# Patient Record
Sex: Female | Born: 1955 | Race: White | Hispanic: No | Marital: Married | Smoking: Former smoker
Health system: Southern US, Community
[De-identification: ages and names within clinical notes are randomized; demographics above are authoritative.]

## PROBLEM LIST (undated history)

## (undated) DIAGNOSIS — I4891 Unspecified atrial fibrillation: Secondary | ICD-10-CM

## (undated) DIAGNOSIS — M8580 Other specified disorders of bone density and structure, unspecified site: Secondary | ICD-10-CM

## (undated) DIAGNOSIS — H01009 Unspecified blepharitis unspecified eye, unspecified eyelid: Secondary | ICD-10-CM

## (undated) DIAGNOSIS — K5790 Diverticulosis of intestine, part unspecified, without perforation or abscess without bleeding: Secondary | ICD-10-CM

## (undated) DIAGNOSIS — K644 Residual hemorrhoidal skin tags: Secondary | ICD-10-CM

## (undated) DIAGNOSIS — L219 Seborrheic dermatitis, unspecified: Secondary | ICD-10-CM

## (undated) DIAGNOSIS — M659 Synovitis and tenosynovitis, unspecified: Secondary | ICD-10-CM

## (undated) DIAGNOSIS — Z780 Asymptomatic menopausal state: Secondary | ICD-10-CM

## (undated) DIAGNOSIS — I10 Essential (primary) hypertension: Secondary | ICD-10-CM

## (undated) DIAGNOSIS — E559 Vitamin D deficiency, unspecified: Secondary | ICD-10-CM

## (undated) DIAGNOSIS — L659 Nonscarring hair loss, unspecified: Secondary | ICD-10-CM

## (undated) DIAGNOSIS — I272 Pulmonary hypertension, unspecified: Secondary | ICD-10-CM

## (undated) DIAGNOSIS — M1611 Unilateral primary osteoarthritis, right hip: Secondary | ICD-10-CM

## (undated) DIAGNOSIS — M2040 Other hammer toe(s) (acquired), unspecified foot: Secondary | ICD-10-CM

## (undated) DIAGNOSIS — G5620 Lesion of ulnar nerve, unspecified upper limb: Secondary | ICD-10-CM

## (undated) DIAGNOSIS — M1990 Unspecified osteoarthritis, unspecified site: Secondary | ICD-10-CM

## (undated) DIAGNOSIS — M21619 Bunion of unspecified foot: Secondary | ICD-10-CM

## (undated) DIAGNOSIS — I2699 Other pulmonary embolism without acute cor pulmonale: Secondary | ICD-10-CM

## (undated) DIAGNOSIS — J309 Allergic rhinitis, unspecified: Secondary | ICD-10-CM

## (undated) DIAGNOSIS — M65939 Unspecified synovitis and tenosynovitis, unspecified forearm: Secondary | ICD-10-CM

## (undated) HISTORY — DX: Unilateral primary osteoarthritis, right hip: M16.11

## (undated) HISTORY — DX: Essential (primary) hypertension: I10

## (undated) HISTORY — DX: Residual hemorrhoidal skin tags: K64.4

## (undated) HISTORY — DX: Other pulmonary embolism without acute cor pulmonale: I26.99

## (undated) HISTORY — DX: Nonscarring hair loss, unspecified: L65.9

## (undated) HISTORY — DX: Pulmonary hypertension, unspecified: I27.20

## (undated) HISTORY — PX: HEMORRHOID SURGERY: SHX153

## (undated) HISTORY — DX: Allergic rhinitis, unspecified: J30.9

## (undated) HISTORY — DX: Synovitis and tenosynovitis, unspecified: M65.9

## (undated) HISTORY — PX: OTHER SURGICAL HISTORY: SHX169

## (undated) HISTORY — DX: Unspecified blepharitis unspecified eye, unspecified eyelid: H01.009

## (undated) HISTORY — DX: Unspecified synovitis and tenosynovitis, unspecified forearm: M65.939

## (undated) HISTORY — DX: Diverticulosis of intestine, part unspecified, without perforation or abscess without bleeding: K57.90

## (undated) HISTORY — PX: COLONOSCOPY: SHX174

## (undated) HISTORY — DX: Seborrheic dermatitis, unspecified: L21.9

## (undated) HISTORY — DX: Other hammer toe(s) (acquired), unspecified foot: M20.40

## (undated) HISTORY — DX: Vitamin D deficiency, unspecified: E55.9

## (undated) HISTORY — DX: Bunion of unspecified foot: M21.619

## (undated) HISTORY — DX: Asymptomatic menopausal state: Z78.0

## (undated) HISTORY — DX: Lesion of ulnar nerve, unspecified upper limb: G56.20

## (undated) HISTORY — DX: Other specified disorders of bone density and structure, unspecified site: M85.80

## (undated) HISTORY — DX: Unspecified atrial fibrillation: I48.91

---

## 1981-07-06 HISTORY — PX: BREAST BIOPSY: SHX20

## 1994-08-03 DIAGNOSIS — R87612 Low grade squamous intraepithelial lesion on cytologic smear of cervix (LGSIL): Secondary | ICD-10-CM | POA: Insufficient documentation

## 1998-05-18 ENCOUNTER — Ambulatory Visit (HOSPITAL_COMMUNITY): Admission: RE | Admit: 1998-05-18 | Discharge: 1998-05-18 | Payer: Self-pay | Admitting: Family Medicine

## 1998-05-18 ENCOUNTER — Encounter: Payer: Self-pay | Admitting: Family Medicine

## 1998-10-01 ENCOUNTER — Other Ambulatory Visit: Admission: RE | Admit: 1998-10-01 | Discharge: 1998-10-01 | Payer: Self-pay | Admitting: Obstetrics & Gynecology

## 1998-11-27 ENCOUNTER — Other Ambulatory Visit: Admission: RE | Admit: 1998-11-27 | Discharge: 1998-11-27 | Payer: Self-pay | Admitting: Obstetrics & Gynecology

## 1999-10-21 ENCOUNTER — Other Ambulatory Visit: Admission: RE | Admit: 1999-10-21 | Discharge: 1999-10-21 | Payer: Self-pay | Admitting: Obstetrics & Gynecology

## 2000-11-03 ENCOUNTER — Other Ambulatory Visit: Admission: RE | Admit: 2000-11-03 | Discharge: 2000-11-03 | Payer: Self-pay | Admitting: Obstetrics & Gynecology

## 2002-01-23 ENCOUNTER — Other Ambulatory Visit: Admission: RE | Admit: 2002-01-23 | Discharge: 2002-01-23 | Payer: Self-pay | Admitting: Obstetrics & Gynecology

## 2003-04-17 ENCOUNTER — Other Ambulatory Visit: Admission: RE | Admit: 2003-04-17 | Discharge: 2003-04-17 | Payer: Self-pay | Admitting: Obstetrics & Gynecology

## 2003-04-24 ENCOUNTER — Encounter: Admission: RE | Admit: 2003-04-24 | Discharge: 2003-04-24 | Payer: Self-pay | Admitting: Obstetrics & Gynecology

## 2003-04-24 ENCOUNTER — Encounter: Payer: Self-pay | Admitting: Obstetrics & Gynecology

## 2004-07-14 ENCOUNTER — Other Ambulatory Visit: Admission: RE | Admit: 2004-07-14 | Discharge: 2004-07-14 | Payer: Self-pay | Admitting: Obstetrics & Gynecology

## 2005-08-19 ENCOUNTER — Other Ambulatory Visit: Admission: RE | Admit: 2005-08-19 | Discharge: 2005-08-19 | Payer: Self-pay | Admitting: Obstetrics and Gynecology

## 2006-01-15 ENCOUNTER — Encounter: Admission: RE | Admit: 2006-01-15 | Discharge: 2006-01-15 | Payer: Self-pay | Admitting: Cardiology

## 2008-02-09 ENCOUNTER — Ambulatory Visit (HOSPITAL_COMMUNITY): Admission: RE | Admit: 2008-02-09 | Discharge: 2008-02-09 | Payer: Self-pay | Admitting: Family Medicine

## 2008-03-09 ENCOUNTER — Ambulatory Visit (HOSPITAL_COMMUNITY): Admission: RE | Admit: 2008-03-09 | Discharge: 2008-03-09 | Payer: Self-pay | Admitting: Family Medicine

## 2008-03-09 ENCOUNTER — Ambulatory Visit: Payer: Self-pay | Admitting: Internal Medicine

## 2008-07-06 HISTORY — PX: BUNIONECTOMY: SHX129

## 2010-07-06 HISTORY — PX: HEMORRHOID SURGERY: SHX153

## 2010-07-30 LAB — BASIC METABOLIC PANEL
BUN: 14 mg/dL (ref 6–23)
CO2: 29 mEq/L (ref 19–32)
Calcium: 8.9 mg/dL (ref 8.4–10.5)
Chloride: 101 mEq/L (ref 96–112)
Creatinine, Ser: 0.59 mg/dL (ref 0.4–1.2)
GFR calc Af Amer: >60 mL/min (ref >60)
GFR calc non Af Amer: >60 mL/min (ref >60)
Glucose, Bld: 96 mg/dL (ref 70–99)
Potassium: 3.8 mEq/L (ref 3.5–5.1)
Sodium: 139 mEq/L (ref 135–145)

## 2010-07-31 ENCOUNTER — Ambulatory Visit
Admission: RE | Admit: 2010-07-31 | Discharge: 2010-07-31 | Payer: Self-pay | Source: Home / Self Care | Attending: General Surgery | Admitting: General Surgery

## 2010-07-31 LAB — POCT I-STAT, CHEM 8
BUN: 14 mg/dL (ref 6–23)
Calcium, Ion: 1.13 mmol/L (ref 1.12–1.32)
Chloride: 102 mEq/L (ref 96–112)
Creatinine, Ser: 0.7 mg/dL (ref 0.4–1.2)
Glucose, Bld: 86 mg/dL (ref 70–99)
HCT: 35 % — ABNORMAL LOW (ref 36.0–46.0)
Hemoglobin: 11.9 g/dL — ABNORMAL LOW (ref 12.0–15.0)
Potassium: 3.2 mEq/L — ABNORMAL LOW (ref 3.5–5.1)
Sodium: 140 mEq/L (ref 135–145)
TCO2: 30 mmol/L (ref 0–100)

## 2010-07-31 LAB — POCT HEMOGLOBIN-HEMACUE: Hemoglobin: 12 g/dL (ref 12.0–15.0)

## 2010-08-05 NOTE — Op Note (Signed)
NAME:  Lindsey Stone, Lindsey Stone NO.:  [id number]  MEDICAL RECORD NO.:  [id number]          PATIENT TYPE:  AMB  LOCATION:  DSC                          FACILITY:  [facility]  PHYSICIAN:  Gabrielle Dare. Janee Morn, M.D.DATE OF BIRTH:  Jan 25, 1956  DATE OF PROCEDURE:  07/31/2010 DATE OF DISCHARGE:                              OPERATIVE REPORT   PREOPERATIVE DIAGNOSIS:  External hemorrhoids and anal skin tags.  POSTOPERATIVE DIAGNOSIS:  External hemorrhoids and anal skin tags.  PROCEDURE:  External hemorrhoidectomy and excision of anal skin tags.  SURGEON:  Gabrielle Dare. Janee Morn, MD  ANESTHESIA:  General with laryngeal mask airway  HISTORY OF PRESENT ILLNESS:  Lindsey Stone is a 55 year old female who has had a multiple year history of the external hemorrhoids.  She is having itching and intermittent bleeding.  I evaluated her in the office and she presents today for external hemorrhoidectomy and excision of anal skin tags.  PROCEDURE IN DETAIL:  Informed consent was obtained.  The patient was identified in the preop holding area.  She received intravenous antibiotics.  She was brought to the operating room.  General anesthesia with laryngeal mask airway was administered by the anesthesia staff. She was placed in the lithotomy position.  Her perianal area was prepped and draped in sterile fashion.  Time-out procedure was done. Examination under anesthesia revealed a circumferential anal skin tags, two with some small external hemorrhoid components.  Digital rectal exam revealed no significant internal hemorrhoids and a rectal retractor was inserted and no significant internal rectal lesions were noted.  Next, we proceeded with excision of large anal skin tag that extended from about 10 o'clock to 2 o'clock.  This was grasped with an Allis clamp. We then injected 0.25% Marcaine with epinephrine liberally around the anal opening.  An incision was made along the base.  It was elliptical in  shape to encompass this large anal skin tag.  Next, it was incised along the anoderm as well to encompass the entirety.  Tissues were then dissected using Bovie cautery and one small vessel was cauterized as well and the specimen was removed.  Hemostasis was obtained in that area with Bovie cautery and then the defect was closed with a running 3-0 Vicryl suture with good hemostasis.  Attention was then directed to the next portion which ran from about 9 o'clock to 6 o'clock.  Again, an elliptical incision was made around the base.  Tissues and small vessel were taken down and then cauterized well.  The specimen was removed. The defect was closed again with a running 3-0 Vicryl suture and there was good hemostasis.  There was one other tack on the underside that extended from approximately 3 o'clock to 5 o'clock.  There was no external hemorrhoid currently.  The skin tag was excised with an elliptical incision at the base and the tissues were taken down with Bovie cautery and the specimen was sent to pathology.  The defect was closed again with running 3-0 Vicryl suture and there was good hemostasis.  Lubricated digital rectal exam was again done confirming good maintenance of the anal opening.  Some dry  sponge was packed and held there for a minute or two achieving excellent hemostasis. Circumferentially, now the external anal area was smooth and flat.  Some local anesthetic impregnated jelly was placed around a folded up piece of Gelfoam and after confirming good hemostasis, this was placed and left as an endorectal dressing.  Some more of the local anesthetic lubricant gel was left around the anal opening as well followed by a sterile gauze.  Sponge, needle, and instrument counts were all correct.  The patient tolerated the procedure well without apparent complication and was taken to the recovery room in stable condition.     Gabrielle Dare Janee Morn, M.D.     BET/MEDQ  D:   07/31/2010  T:  07/31/2010  Job:  [id number]  cc:   Sigmund Hazel, M.D.  Electronically Signed by Violeta Gelinas M.D. on 08/05/2010 12:49:46 PM

## 2011-11-04 HISTORY — PX: HEMORRHOID BANDING: SHX5850

## 2012-05-13 ENCOUNTER — Other Ambulatory Visit: Payer: Self-pay | Admitting: Obstetrics & Gynecology

## 2014-04-30 ENCOUNTER — Other Ambulatory Visit: Payer: Self-pay | Admitting: Obstetrics & Gynecology

## 2014-05-01 LAB — CYTOLOGY - PAP

## 2015-10-25 ENCOUNTER — Other Ambulatory Visit: Payer: Self-pay | Admitting: Obstetrics & Gynecology

## 2018-05-31 ENCOUNTER — Ambulatory Visit
Admission: RE | Admit: 2018-05-31 | Discharge: 2018-05-31 | Disposition: A | Discharge status: Home / Self Care | Payer: No Typology Code available for payment source | Source: Ambulatory Visit | Attending: Chiropractic Medicine | Admitting: Chiropractic Medicine

## 2018-05-31 ENCOUNTER — Other Ambulatory Visit: Payer: Self-pay | Admitting: Chiropractic Medicine

## 2018-05-31 DIAGNOSIS — M25551 Pain in right hip: Principal | ICD-10-CM

## 2018-05-31 DIAGNOSIS — M2560 Stiffness of unspecified joint, not elsewhere classified: Secondary | ICD-10-CM

## 2018-05-31 DIAGNOSIS — G8929 Other chronic pain: Secondary | ICD-10-CM

## 2019-03-14 DIAGNOSIS — M1631 Unilateral osteoarthritis resulting from hip dysplasia, right hip: Secondary | ICD-10-CM | POA: Insufficient documentation

## 2019-11-06 DIAGNOSIS — Z96641 Presence of right artificial hip joint: Secondary | ICD-10-CM | POA: Insufficient documentation

## 2019-11-13 ENCOUNTER — Inpatient Hospital Stay (HOSPITAL_COMMUNITY)
Admission: EM | Admit: 2019-11-13 | Discharge: 2019-11-16 | DRG: 982 | Disposition: A | Discharge status: Home / Self Care | Payer: No Typology Code available for payment source | Attending: Internal Medicine | Admitting: Internal Medicine

## 2019-11-13 ENCOUNTER — Observation Stay (HOSPITAL_COMMUNITY): Payer: No Typology Code available for payment source

## 2019-11-13 ENCOUNTER — Other Ambulatory Visit: Payer: Self-pay

## 2019-11-13 ENCOUNTER — Encounter (HOSPITAL_COMMUNITY): Payer: Self-pay | Admitting: Emergency Medicine

## 2019-11-13 DIAGNOSIS — K625 Hemorrhage of anus and rectum: Secondary | ICD-10-CM | POA: Diagnosis not present

## 2019-11-13 DIAGNOSIS — Z791 Long term (current) use of non-steroidal anti-inflammatories (NSAID): Secondary | ICD-10-CM

## 2019-11-13 DIAGNOSIS — D62 Acute posthemorrhagic anemia: Secondary | ICD-10-CM | POA: Diagnosis present

## 2019-11-13 DIAGNOSIS — Z20822 Contact with and (suspected) exposure to covid-19: Secondary | ICD-10-CM | POA: Diagnosis present

## 2019-11-13 DIAGNOSIS — K7689 Other specified diseases of liver: Secondary | ICD-10-CM | POA: Diagnosis present

## 2019-11-13 DIAGNOSIS — M1990 Unspecified osteoarthritis, unspecified site: Secondary | ICD-10-CM | POA: Diagnosis not present

## 2019-11-13 DIAGNOSIS — M5136 Other intervertebral disc degeneration, lumbar region: Secondary | ICD-10-CM | POA: Diagnosis present

## 2019-11-13 DIAGNOSIS — E876 Hypokalemia: Secondary | ICD-10-CM | POA: Diagnosis not present

## 2019-11-13 DIAGNOSIS — M25552 Pain in left hip: Secondary | ICD-10-CM | POA: Diagnosis present

## 2019-11-13 DIAGNOSIS — T80818A Extravasation of other vesicant agent, initial encounter: Secondary | ICD-10-CM | POA: Diagnosis present

## 2019-11-13 DIAGNOSIS — Z8249 Family history of ischemic heart disease and other diseases of the circulatory system: Secondary | ICD-10-CM

## 2019-11-13 DIAGNOSIS — Z83438 Family history of other disorder of lipoprotein metabolism and other lipidemia: Secondary | ICD-10-CM

## 2019-11-13 DIAGNOSIS — Z7982 Long term (current) use of aspirin: Secondary | ICD-10-CM

## 2019-11-13 DIAGNOSIS — K922 Gastrointestinal hemorrhage, unspecified: Secondary | ICD-10-CM

## 2019-11-13 DIAGNOSIS — I10 Essential (primary) hypertension: Secondary | ICD-10-CM | POA: Diagnosis present

## 2019-11-13 DIAGNOSIS — K921 Melena: Secondary | ICD-10-CM

## 2019-11-13 DIAGNOSIS — Z96641 Presence of right artificial hip joint: Secondary | ICD-10-CM | POA: Diagnosis present

## 2019-11-13 DIAGNOSIS — D500 Iron deficiency anemia secondary to blood loss (chronic): Secondary | ICD-10-CM

## 2019-11-13 DIAGNOSIS — I959 Hypotension, unspecified: Secondary | ICD-10-CM | POA: Diagnosis present

## 2019-11-13 DIAGNOSIS — K5790 Diverticulosis of intestine, part unspecified, without perforation or abscess without bleeding: Secondary | ICD-10-CM | POA: Diagnosis not present

## 2019-11-13 DIAGNOSIS — K5731 Diverticulosis of large intestine without perforation or abscess with bleeding: Secondary | ICD-10-CM | POA: Diagnosis not present

## 2019-11-13 DIAGNOSIS — Z79891 Long term (current) use of opiate analgesic: Secondary | ICD-10-CM

## 2019-11-13 DIAGNOSIS — Z87891 Personal history of nicotine dependence: Secondary | ICD-10-CM

## 2019-11-13 DIAGNOSIS — Z79899 Other long term (current) drug therapy: Secondary | ICD-10-CM

## 2019-11-13 HISTORY — PX: IR ANGIOGRAM SELECTIVE EACH ADDITIONAL VESSEL: IMG667

## 2019-11-13 HISTORY — PX: IR EMBO ART  VEN HEMORR LYMPH EXTRAV  INC GUIDE ROADMAPPING: IMG5450

## 2019-11-13 HISTORY — PX: IR ANGIOGRAM VISCERAL SELECTIVE: IMG657

## 2019-11-13 HISTORY — DX: Unspecified osteoarthritis, unspecified site: M19.90

## 2019-11-13 HISTORY — PX: IR US GUIDE VASC ACCESS RIGHT: IMG2390

## 2019-11-13 LAB — PREPARE RBC (CROSSMATCH)

## 2019-11-13 LAB — CBC WITH DIFFERENTIAL/PLATELET
Abs Immature Granulocytes: 0.05 10*3/uL (ref 0.00–0.07)
Basophils Absolute: 0.1 10*3/uL (ref 0.0–0.1)
Basophils Relative: 1 %
Eosinophils Absolute: 0.1 10*3/uL (ref 0.0–0.5)
Eosinophils Relative: 2 %
HCT: 29.9 % — ABNORMAL LOW (ref 36.0–46.0)
Hemoglobin: 9.5 g/dL — ABNORMAL LOW (ref 12.0–15.0)
Immature Granulocytes: 1 %
Lymphocytes Relative: 12 %
Lymphs Abs: 0.9 10*3/uL (ref 0.7–4.0)
MCH: 29.8 pg (ref 26.0–34.0)
MCHC: 31.8 g/dL (ref 30.0–36.0)
MCV: 93.7 fL (ref 80.0–100.0)
Monocytes Absolute: 0.8 10*3/uL (ref 0.1–1.0)
Monocytes Relative: 10 %
Neutro Abs: 6 10*3/uL (ref 1.7–7.7)
Neutrophils Relative %: 74 %
Platelets: 185 10*3/uL (ref 150–400)
RBC: 3.19 MIL/uL — ABNORMAL LOW (ref 3.87–5.11)
RDW: 12.5 % (ref 11.5–15.5)
WBC: 7.9 10*3/uL (ref 4.0–10.5)
nRBC: 0 % (ref 0.0–0.2)

## 2019-11-13 LAB — HEMOGLOBIN AND HEMATOCRIT, BLOOD
HCT: 26.7 % — ABNORMAL LOW (ref 36.0–46.0)
HCT: 23.8 % — ABNORMAL LOW (ref 36.0–46.0)
Hemoglobin: 8.4 g/dL — ABNORMAL LOW (ref 12.0–15.0)
Hemoglobin: 7.7 g/dL — ABNORMAL LOW (ref 12.0–15.0)

## 2019-11-13 LAB — ABO/RH: ABO/RH(D): A POS

## 2019-11-13 LAB — COMPREHENSIVE METABOLIC PANEL
ALT: 13 U/L (ref 0–44)
AST: 16 U/L (ref 15–41)
Albumin: 2.6 g/dL — ABNORMAL LOW (ref 3.5–5.0)
Alkaline Phosphatase: 74 U/L (ref 38–126)
Anion gap: 9 (ref 5–15)
BUN: 13 mg/dL (ref 8–23)
CO2: 23 mmol/L (ref 22–32)
Calcium: 8.1 mg/dL — ABNORMAL LOW (ref 8.9–10.3)
Chloride: 107 mmol/L (ref 98–111)
Creatinine, Ser: 0.72 mg/dL (ref 0.44–1.00)
GFR calc Af Amer: >60 mL/min (ref >60)
GFR calc non Af Amer: >60 mL/min (ref >60)
Glucose, Bld: 105 mg/dL — ABNORMAL HIGH (ref 70–99)
Potassium: 3.5 mmol/L (ref 3.5–5.1)
Sodium: 139 mmol/L (ref 135–145)
Total Bilirubin: 0.6 mg/dL (ref 0.3–1.2)
Total Protein: 5.4 g/dL — ABNORMAL LOW (ref 6.5–8.1)

## 2019-11-13 LAB — SARS CORONAVIRUS 2 BY RT PCR (HOSPITAL ORDER, PERFORMED IN ~~LOC~~ HOSPITAL LAB): SARS Coronavirus 2: NEGATIVE

## 2019-11-13 LAB — PROTIME-INR
INR: 1.1 (ref 0.8–1.2)
Prothrombin Time: 13.7 seconds (ref 11.4–15.2)

## 2019-11-13 MED ORDER — LIDOCAINE HCL 1 % IJ SOLN
INTRAMUSCULAR | Status: DC | PRN
  Administered 2019-11-13: 10 mL

## 2019-11-13 MED ORDER — SODIUM CHLORIDE 0.9 % IV BOLUS
1000.0000 mL | Freq: Once | INTRAVENOUS | Status: AC
  Administered 2019-11-13: 16:00:00 1000 mL via INTRAVENOUS

## 2019-11-13 MED ORDER — SODIUM CHLORIDE 0.9 % IV SOLN: INTRAVENOUS | Status: DC

## 2019-11-13 MED ORDER — FENTANYL CITRATE (PF) 100 MCG/2ML IJ SOLN
INTRAMUSCULAR | Status: DC | PRN
  Administered 2019-11-13 (×2): 25 ug via INTRAVENOUS

## 2019-11-13 MED ORDER — ALBUTEROL SULFATE (2.5 MG/3ML) 0.083% IN NEBU: 2.5000 mg | INHALATION_SOLUTION | Freq: Four times a day (QID) | RESPIRATORY_TRACT | Status: DC | PRN

## 2019-11-13 MED ORDER — SODIUM CHLORIDE 0.9% FLUSH
3.0000 mL | Freq: Two times a day (BID) | INTRAVENOUS | Status: DC
  Administered 2019-11-13 – 2019-11-15 (×4): 3 mL via INTRAVENOUS

## 2019-11-13 MED ORDER — IOHEXOL 300 MG/ML  SOLN
150.0000 mL | Freq: Once | INTRAMUSCULAR | Status: AC | PRN
  Administered 2019-11-13: 50 mL via INTRA_ARTERIAL

## 2019-11-13 MED ORDER — ONDANSETRON HCL 4 MG/2ML IJ SOLN
4.0000 mg | Freq: Once | INTRAMUSCULAR | Status: AC
  Administered 2019-11-13: 4 mg via INTRAVENOUS

## 2019-11-13 MED ORDER — MIDAZOLAM HCL 2 MG/2ML IJ SOLN
INTRAMUSCULAR | Status: DC | PRN
  Administered 2019-11-13 (×2): 1 mg via INTRAVENOUS

## 2019-11-13 MED ORDER — ONDANSETRON HCL 4 MG PO TABS: 4.0000 mg | ORAL_TABLET | Freq: Four times a day (QID) | ORAL | Status: DC | PRN

## 2019-11-13 MED ORDER — LIDOCAINE HCL 1 % IJ SOLN
INTRAMUSCULAR | Status: AC
  Filled 2019-11-13: qty 20

## 2019-11-13 MED ORDER — SODIUM CHLORIDE 0.9 % IV SOLN: 10.0000 mL/h | Freq: Once | INTRAVENOUS | Status: DC

## 2019-11-13 MED ORDER — FENTANYL CITRATE (PF) 100 MCG/2ML IJ SOLN
INTRAMUSCULAR | Status: AC
  Filled 2019-11-13: qty 2

## 2019-11-13 MED ORDER — ACETAMINOPHEN 650 MG RE SUPP: 650.0000 mg | Freq: Four times a day (QID) | RECTAL | Status: DC | PRN

## 2019-11-13 MED ORDER — ACETAMINOPHEN 325 MG PO TABS
650.0000 mg | ORAL_TABLET | Freq: Four times a day (QID) | ORAL | Status: DC | PRN
  Administered 2019-11-14: 22:00:00 650 mg via ORAL
  Filled 2019-11-13 (×2): qty 2

## 2019-11-13 MED ORDER — ONDANSETRON HCL 4 MG/2ML IJ SOLN: 4.0000 mg | Freq: Four times a day (QID) | INTRAMUSCULAR | Status: DC | PRN

## 2019-11-13 MED ORDER — ONDANSETRON HCL 4 MG/2ML IJ SOLN
INTRAMUSCULAR | Status: AC
  Filled 2019-11-13: qty 2

## 2019-11-13 MED ORDER — MIDAZOLAM HCL 2 MG/2ML IJ SOLN
INTRAMUSCULAR | Status: AC
  Filled 2019-11-13: qty 2

## 2019-11-13 MED ORDER — IOHEXOL 350 MG/ML SOLN
100.0000 mL | Freq: Once | INTRAVENOUS | Status: AC | PRN
  Administered 2019-11-13: 15:00:00 100 mL via INTRAVENOUS

## 2019-11-13 MED ORDER — SODIUM CHLORIDE 0.9 % IV BOLUS
500.0000 mL | Freq: Once | INTRAVENOUS | Status: AC
  Administered 2019-11-13: 11:00:00 500 mL via INTRAVENOUS

## 2019-11-13 MED ORDER — IOHEXOL 300 MG/ML  SOLN
150.0000 mL | Freq: Once | INTRAMUSCULAR | Status: AC | PRN
  Administered 2019-11-13: 75 mL via INTRA_ARTERIAL

## 2019-11-13 NOTE — H&P (Addendum)
History and Physical    Lindsey Stone LKG:[id number] DOB: July 16, 1955 DOA: 11/13/2019  Referring MD/NP/PA: Sherwood Gambler, MD PCP: Kathyrn Lass, MD  Patient coming from: PCP office via EMS  Chief Complaint: Rectal bleeding  I have personally briefly reviewed patient's old medical records in [place]   HPI: Lindsey Stone is a 64 y.o. female with medical history significant of diverticulosis, arthritis, and right hip replacement 5 weeks ago who presents with complaints of rectal bleeding starting this morning around 4 AM.  She had several episodes of diarrhea with maroon-colored clots this morning.  Patient was seen briefly at her primary care office this morning, but ultimately was sent by ambulance to the ED for further evaluation. Associated symptoms of feeling lightheaded and some abdominal bloat.  In route with EMS patient was temporarily noted to be hypotensive and given 500 mL bolus of fluids.  She has been taking ibuprofen usually just once daily for joint pains, and is on a daily aspirin. Last colonoscopy performed by Dr. Earlean Shawl in September 2018 noted moderate diverticulosis.  She has had intermittent bleeding in the past, but denies ever having bleeding like this before.  ED Course: Upon admission into the emergency department patient was noted to be afebrile, pulse 87 108, respirations 11-30, blood pressures 74/48-125/49, O2 saturation maintained on room air.  Labs significant for hemoglobin 9.5(last available around 12 in 2012).  GI formally consulted and recommended checking CT angiogram of the abdomen.  Patient was typed and screened and ordered to be transfused 1 unit of packed red blood cells due to hypotension.  Review of Systems  Constitutional: Negative for fever and malaise/fatigue.  HENT: Negative for ear discharge.   Eyes: Negative for photophobia.  Respiratory: Negative for cough, shortness of breath and stridor.   Cardiovascular: Negative for chest pain and  leg swelling.  Gastrointestinal: Positive for blood in stool. Negative for abdominal pain, nausea and vomiting.       Positive for abdominal bloating  Genitourinary: Negative for dysuria and hematuria.  Musculoskeletal: Negative for falls.  Neurological: Positive for dizziness. Negative for loss of consciousness.  Endo/Heme/Allergies: Does not bruise/bleed easily.  Psychiatric/Behavioral: Negative for memory loss and substance abuse. The patient does not have insomnia.     Past Medical History:  Diagnosis Date  . Arthritis     Past Surgical History:  Procedure Laterality Date  . HEMORRHOID SURGERY    . right hand surgery    . Right hip replaced       reports that she has quit smoking. She has never used smokeless tobacco. She reports current alcohol use. She reports that she does not use drugs.  No Known Allergies  Family History  Problem Relation Age of Onset  . Hypertension Mother   . Hyperlipidemia Mother   . Hypertension Father   . Hyperlipidemia Father   . Deep vein thrombosis Father   . Heart disease Father        Required pacemaker    Prior to Admission medications   Not on File    Physical Exam:  Constitutional: Older female currently in NAD, calm, comfortable Vitals:   11/13/19 1345 11/13/19 1400 11/13/19 1415 11/13/19 1430  BP: 118/64 115/64 104/75 122/61  Pulse: 88 92 92 92  Resp: 15 (!) 23 (!) 22 (!) 25  Temp:      TempSrc:      SpO2: 100% 99% 100% 99%  Weight:       Eyes: PERRL, lids and  conjunctivae normal ENMT: Mucous membranes are dry. Posterior pharynx clear of any exudate or lesions.  Neck: normal, supple, no masses, no thyromegaly Respiratory: clear to auscultation bilaterally, no wheezing, no crackles. Normal respiratory effort. No accessory muscle use.  Cardiovascular: mildly tachycardic, no murmurs / rubs / gallops. No extremity edema. 2+ pedal pulses. No carotid bruits.  Abdomen: no tenderness, no masses palpated. No  hepatosplenomegaly. Bowel sounds positive.  Musculoskeletal: no clubbing / cyanosis. No joint deformity upper and lower extremities. Good ROM, no contractures. Normal muscle tone.  Skin: no rashes, lesions, ulcers. No induration Neurologic: CN 2-12 grossly intact. Sensation intact, DTR normal. Strength 5/5 in all 4.  Psychiatric: Normal judgment and insight. Alert and oriented x 3. Normal mood.     Labs on Admission: I have personally reviewed following labs and imaging studies  CBC: Recent Labs  Lab 11/13/19 1015  WBC 7.9  NEUTROABS 6.0  HGB 9.5*  HCT 29.9*  MCV 93.7  PLT 185   Basic Metabolic Panel: Recent Labs  Lab 11/13/19 1015  NA 139  K 3.5  CL 107  CO2 23  GLUCOSE 105*  BUN 13  CREATININE 0.72  CALCIUM 8.1*   GFR: CrCl cannot be calculated (Unknown ideal weight.). Liver Function Tests: Recent Labs  Lab 11/13/19 1015  AST 16  ALT 13  ALKPHOS 74  BILITOT 0.6  PROT 5.4*  ALBUMIN 2.6*   No results for input(s): LIPASE, AMYLASE in the last 168 hours. No results for input(s): AMMONIA in the last 168 hours. Coagulation Profile: Recent Labs  Lab 11/13/19 1015  INR 1.1   Cardiac Enzymes: No results for input(s): CKTOTAL, CKMB, CKMBINDEX, TROPONINI in the last 168 hours. BNP (last 3 results) No results for input(s): PROBNP in the last 8760 hours. HbA1C: No results for input(s): HGBA1C in the last 72 hours. CBG: No results for input(s): GLUCAP in the last 168 hours. Lipid Profile: No results for input(s): CHOL, HDL, LDLCALC, TRIG, CHOLHDL, LDLDIRECT in the last 72 hours. Thyroid Function Tests: No results for input(s): TSH, T4TOTAL, FREET4, T3FREE, THYROIDAB in the last 72 hours. Anemia Panel: No results for input(s): VITAMINB12, FOLATE, FERRITIN, TIBC, IRON, RETICCTPCT in the last 72 hours. Urine analysis: No results found for: COLORURINE, APPEARANCEUR, LABSPEC, PHURINE, GLUCOSEU, HGBUR, BILIRUBINUR, KETONESUR, PROTEINUR, UROBILINOGEN, NITRITE,  LEUKOCYTESUR Sepsis Labs: Recent Results (from the past 240 hour(s))  SARS Coronavirus 2 by RT PCR (hospital order, performed in [facility] hospital lab) Nasopharyngeal Nasopharyngeal Swab     Status: None   Collection Time: 11/13/19 10:33 AM   Specimen: Nasopharyngeal Swab  Result Value Ref Range Status   SARS Coronavirus 2 NEGATIVE NEGATIVE Final    Comment: (NOTE) SARS-CoV-2 target nucleic acids are NOT DETECTED. The SARS-CoV-2 RNA is generally detectable in upper and lower respiratory specimens during the acute phase of infection. The lowest concentration of SARS-CoV-2 viral copies this assay can detect is 250 copies / mL. A negative result does not preclude SARS-CoV-2 infection and should not be used as the sole basis for treatment or other patient management decisions.  A negative result may occur with improper specimen collection / handling, submission of specimen other than nasopharyngeal swab, presence of viral mutation(s) within the areas targeted by this assay, and inadequate number of viral copies (<250 copies / mL). A negative result must be combined with clinical observations, patient history, and epidemiological information. Fact Sheet for Patients:   BoilerBrush.com.cy Fact Sheet for Healthcare Providers: https://pope.com/ This test is not yet approved or  cleared  by the [place] and has been authorized for detection and/or diagnosis of SARS-CoV-2 by FDA under an Emergency Use Authorization (EUA).  This EUA will remain in effect (meaning this test can be used) for the duration of the COVID-19 declaration under Section 564(b)(1) of the Act, 21 U.S.C. section 360bbb-3(b)(1), unless the authorization is terminated or revoked sooner. Performed at [facility], [address], [place], [place] [address]      Radiological Exams on Admission: No results found.    Assessment/Plan  Lower GI bleed Acute  blood loss anemia: Acute.  Patient presents with complaints of rectal bleeding.  Hemoglobin initially noted to be as low as 9.5 upon admission with guaiac positive stool.  Last noted to be around 12 back in 2012.  She is not on any anticoagulants, but does take a daily aspirin as well as ibuprofen intermittently for joint pain.  Her last colonoscopy was back in 2018 with Dr. Kinnie Scales which revealed moderate diverticulosis.  Suspect diverticular bleed findings.  Patient was transfused 1 unit of packed red blood cells.  About a GI have been consulted and saw the patient recommended CT of the abdomen and pelvis. -Admit to a progressive bed. -Trend H&H -Continue to monitor and transfuse blood products as needed. -Continue's IV fluids at 100 mL/h -Follow-up CT of the abdomen and pelvis. -Appreciate [facility] consultative services, we will follow-up further recommendations -IR was consulted and took the patient for interventional intervention after CT angiogram revealed active bleeding.  History of diverticulosis  Arthritis: Patient has been using ibuprofen as needed for joint pains.   -Hold ibuprofen    DVT prophylaxis: SCDs Code Status: Full Family Communication: Discussed plan of care with the patient's husband present at bedside  Disposition Plan: Possible discharge home in 1 to 2 days Consults called: Gastroenterology Admission status: Observation   Clydie Braun MD [facility] Pager [phone]   If 7PM-7AM, please contact night-coverage [email or web address] Password [facility]  11/13/2019, 3:04 PM

## 2019-11-13 NOTE — Sedation Documentation (Signed)
Patient is resting comfortably. 

## 2019-11-13 NOTE — Sedation Documentation (Signed)
Pt states that her right leg is throbbing in the quadricept area. Advised pt not to tense this leg, to try to relax. Se verbalized understanding. See [facility]

## 2019-11-13 NOTE — Sedation Documentation (Signed)
Vital signs stable. 

## 2019-11-13 NOTE — Sedation Documentation (Signed)
Pt is sleeping at this time, vitals stable, procedure continues

## 2019-11-13 NOTE — ED Notes (Signed)
NP from IR in talking with pt and pt's husband

## 2019-11-13 NOTE — Sedation Documentation (Signed)
Pt arrived to IR suite alert and oriented. Consent signed, pt is npo. Pt has had two bm in IR-maroon, approx 200cc

## 2019-11-13 NOTE — Procedures (Signed)
Interventional Radiology Procedure Note  Procedure: SMA ANGIO AND EMBO OF RLQ MARGINAL BRANCH AT BLEEDING SITE  Complications: None  Estimated Blood Loss: MIN  Findings: TWO MICROCOILS DEPLOYED AT SMALL MARGINAL BRANCH TO BLEEDING SITE

## 2019-11-13 NOTE — ED Notes (Signed)
Pt to IR at this time

## 2019-11-13 NOTE — ED Notes (Signed)
Pt had another two BRB stools

## 2019-11-13 NOTE — ED Provider Notes (Signed)
[facility] EMERGENCY DEPARTMENT Provider Note   CSN: [id number] Arrival date & time: 11/13/19  1004     History Chief Complaint  Patient presents with  . Rectal Bleeding    Lindsey Stone is a 64 y.o. female.  HPI 64 year old female presents with rectal bleeding.  Started this morning around 6 AM.  She has noticed about 12 episodes of pretty much just blood and clots coming out.  No abdominal pain.  She has been feeling lightheaded recently, especially when standing.  Felt like she was about to pass out when she went to her doctor's office.  Had a colonoscopy about 5 years ago that noted some diverticulosis but no obvious polyps.  She has had some rectal bleeding in the past due to hemorrhoids that were removed but this is way worse.  No rectal pain or feeling like she has a hemorrhoid.  Takes a baby aspirin as she recently had hip surgery but no other blood thinners.  Occasional NSAID use but not consistently.  No significant EtOH use/abuse. Was given 500 mL IV fluid bolus by EMS.  Past Medical History:  Diagnosis Date  . Arthritis     Patient Active Problem List   Diagnosis Date Noted  . Rectal bleeding 11/13/2019    Past Surgical History:  Procedure Laterality Date  . HEMORRHOID SURGERY    . right hand surgery    . Right hip replaced       OB History   No obstetric history on file.     Family History  Problem Relation Age of Onset  . Hypertension Mother   . Hyperlipidemia Mother   . Hypertension Father   . Hyperlipidemia Father   . Deep vein thrombosis Father   . Heart disease Father        Required pacemaker    Social History   Tobacco Use  . Smoking status: Former Games developer  . Smokeless tobacco: Never Used  . Tobacco comment: quit in 2003  Substance Use Topics  . Alcohol use: Yes  . Drug use: Never    Home Medications Prior to Admission medications   Medication Sig Start Date End Date Taking? Authorizing Provider  estradiol  (ESTRACE) 2 MG tablet Take 2 mg by mouth daily. 08/25/19  Yes [provider]  hydrochlorothiazide (HYDRODIURIL) 12.5 MG tablet Take 12.5 mg by mouth daily. 09/08/19  Yes [provider]  ibuprofen (ADVIL) 200 MG tablet Take 200 mg by mouth every 4 (four) hours as needed for pain.   Yes [provider]  OVER THE COUNTER MEDICATION Take 1 tablet by mouth daily as needed. OTC allergy medicine   Yes [provider]  oxyCODONE (OXY IR/ROXICODONE) 5 MG immediate release tablet Take 5-10 mg by mouth every 6 (six) hours as needed for pain. 10/03/19  Yes [provider]  progesterone (PROMETRIUM) 100 MG capsule Take 100 mg by mouth See admin instructions. Take 1 capsule (100mg ) by mouth for 10 days. Every 2 months   Yes [provider]    Allergies    Patient has no known allergies.  Review of Systems   Review of Systems  Gastrointestinal: Positive for blood in stool. Negative for abdominal pain and rectal pain.  Neurological: Positive for light-headedness.  All other systems reviewed and are negative.   Physical Exam Updated Vital Signs BP 122/61   Pulse 92   Temp (!) 97.3 F (36.3 C) (Temporal)   Resp (!) 25   Wt 62.6  kg   SpO2 99%   Physical Exam Vitals and nursing note reviewed. Exam conducted with a chaperone present.  Constitutional:      Appearance: She is well-developed.  HENT:     Head: Normocephalic and atraumatic.     Right Ear: External ear normal.     Left Ear: External ear normal.     Nose: Nose normal.  Eyes:     General:        Right eye: No discharge.        Left eye: No discharge.  Cardiovascular:     Rate and Rhythm: Regular rhythm. Tachycardia present.     Heart sounds: Normal heart sounds.  Pulmonary:     Effort: Pulmonary effort is normal.     Breath sounds: Normal breath sounds.  Abdominal:     General: There is no distension.     Palpations: Abdomen is soft.     Tenderness: There is no abdominal  tenderness.  Genitourinary:    Rectum: No external hemorrhoid. Normal anal tone.     Comments: Dark red blood on DRE. Has skin tag but no obvious hemorrhoids Skin:    General: Skin is warm and dry.  Neurological:     Mental Status: She is alert.  Psychiatric:        Mood and Affect: Mood is not anxious.     ED Results / Procedures / Treatments   Labs (all labs ordered are listed, but only abnormal results are displayed) Labs Reviewed  COMPREHENSIVE METABOLIC PANEL - Abnormal; Notable for the following components:      Result Value   Glucose, Bld 105 (*)    Calcium 8.1 (*)    Total Protein 5.4 (*)    Albumin 2.6 (*)    All other components within normal limits  CBC WITH DIFFERENTIAL/PLATELET - Abnormal; Notable for the following components:   RBC 3.19 (*)    Hemoglobin 9.5 (*)    HCT 29.9 (*)    All other components within normal limits  SARS CORONAVIRUS 2 BY RT PCR (HOSPITAL ORDER, [place] [facility])  PROTIME-INR  HIV ANTIBODY (ROUTINE TESTING W REFLEX)  HEMOGLOBIN AND HEMATOCRIT, BLOOD  HEMOGLOBIN AND HEMATOCRIT, BLOOD  TYPE AND SCREEN  PREPARE RBC (CROSSMATCH)  ABO/RH    EKG None  Radiology No results found.  Procedures .Critical Care Performed by: Sherwood Gambler, MD Authorized by: Sherwood Gambler, MD   Critical care provider statement:    Critical care time (minutes):  40   Critical care time was exclusive of:  Separately billable procedures and treating other patients   Critical care was necessary to treat or prevent imminent or life-threatening deterioration of the following conditions:  Circulatory failure   Critical care was time spent personally by me on the following activities:  Discussions with consultants, evaluation of patient's response to treatment, examination of patient, ordering and performing treatments and interventions, ordering and review of laboratory studies, ordering and review of radiographic studies, pulse oximetry,  re-evaluation of patient's condition, obtaining history from patient or surrogate and review of old charts   (including critical care time)  Medications Ordered in ED Medications  0.9 %  sodium chloride infusion (has no administration in time range)  ondansetron (ZOFRAN) injection 4 mg (has no administration in time range)  0.9 %  sodium chloride infusion (has no administration in time range)  sodium chloride flush (NS) 0.9 % injection 3 mL (has no administration in time range)  albuterol (PROVENTIL) (  2.5 MG/3ML) 0.083% nebulizer solution 2.5 mg (has no administration in time range)  ondansetron (ZOFRAN) tablet 4 mg (has no administration in time range)    Or  ondansetron (ZOFRAN) injection 4 mg (has no administration in time range)  acetaminophen (TYLENOL) tablet 650 mg (has no administration in time range)    Or  acetaminophen (TYLENOL) suppository 650 mg (has no administration in time range)  sodium chloride 0.9 % bolus 500 mL (0 mLs Intravenous Stopped 11/13/19 1150)  iohexol (OMNIPAQUE) 350 MG/ML injection 100 mL (100 mLs Intravenous Contrast Given 11/13/19 1435)    ED Course  I have reviewed the triage vital signs and the nursing notes.  Pertinent labs & imaging results that were available during my care of the patient were reviewed by me and considered in my medical decision making (see chart for details).    MDM Rules/Calculators/A&P                      Labs were reviewed and show new anemia with 9.5 hemoglobin.  However I am suspicion is lower as she has had at least 5 bloody bowel movements with clots.  She is now becoming near syncopal and with some borderline blood pressures.  Her other labs were reviewed and are overall unremarkable.  Thus she was given a unit of blood in addition to a small fluid bolus until the blood was ready.  Discussed with gastroenterology who recommends CT angiography of abdomen/pelvis.  Discussed with the hospitalist for admission. Final Clinical  Impression(s) / ED Diagnoses Final diagnoses:  Rectal bleeding  Blood loss anemia    Rx / DC Orders ED Discharge Orders    None       Pricilla Loveless, MD 11/13/19 [id number]

## 2019-11-13 NOTE — Sedation Documentation (Signed)
Pt denies pain at this time, vitals stable. Procedure continues

## 2019-11-13 NOTE — Consult Note (Signed)
[facility] Gastroenterology Consult Note   History Lindsey Stone MRN # [id number]  Date of Admission: 11/13/2019 Date of Consultation: 11/13/2019 Referring physician: Dr. Norval Morton, MD Primary Care Provider: Kathyrn Lass, MD Primary Gastroenterologist: Dr. Richmond Campbell   Reason for Consultation/Chief Complaint: Hematochezia  Subjective  HPI:  This is a very pleasant 64 year old woman sent by EMS from her 20 office earlier today for lower GI bleeding.  She woke this morning with urgency for loose stool and thought she was having diarrhea.  She then passed maroon blood with clots the few times.  She called her primary care physician's office, was briefly seen there, but continued to have bleeding and felt lightheaded and thus was sent by ambulance to the ED.  She has been intermittently hypotensive but responsive to IV fluids, and currently receiving her first unit of PRBCs.  Blood pressure and heart rate have since stabilized. She has passed bright red blood per rectum with clots 5 or 6 times since arrival in ED about 3 hours ago.  Nesha reports some intermittent hemorrhoidal bleeding in the past, but never anything like this.  She denies abdominal pain, but is somewhat queasy with the recurrent episodes of bleeding today.  She denies vomiting or hematemesis.  No chronic upper digestive symptoms.  Her bowel habits have been regular with no recent changes.  Last colonoscopy with Dr. Richmond Campbell September 2018.  No report available, but patient recalls being told there was diverticulosis.  She has been using occasional ibuprofen for some right knee and left hip pain with a recent hip replacement.  ROS:   All other systems are negative except as noted above in the HPI  Past Medical History History reviewed. No pertinent past medical history.  No chronic medical problems Past Surgical History History reviewed. No pertinent surgical history. Recent hip replacement    family History No family history on file. No known GI malignancies  Social History Social History   Socioeconomic History  . Marital status: Married    Spouse name: Not on file  . Number of children: Not on file  . Years of education: Not on file  . Highest education level: Not on file  Occupational History  . Not on file  Tobacco Use  . Smoking status: Not on file  Substance and Sexual Activity  . Alcohol use: Not on file  . Drug use: Not on file  . Sexual activity: Not on file  Other Topics Concern  . Not on file  Social History Narrative  . Not on file   Social Determinants of Health   Financial Resource Strain:   . Difficulty of Paying Living Expenses:   Food Insecurity:   . Worried About Charity fundraiser in the Last Year:   . Arboriculturist in the Last Year:   Transportation Needs:   . Film/video editor (Medical):   Marland Kitchen Lack of Transportation (Non-Medical):   Physical Activity:   . Days of Exercise per Week:   . Minutes of Exercise per Session:   Stress:   . Feeling of Stress :   Social Connections:   . Frequency of Communication with Friends and Family:   . Frequency of Social Gatherings with Friends and Family:   . Attends Religious Services:   . Active Member of Clubs or Organizations:   . Attends Archivist Meetings:   Marland Kitchen Marital Status:     Allergies No Known Allergies  Outpatient Meds Home medications  from the H+P and/or nursing med reconciliation reviewed.  Inpatient med list reviewed  _____________________________________________________________________ Objective   Exam:  Current vital signs  Patient Vitals for the past 8 hrs:  BP Temp Temp src Pulse Resp SpO2 Weight  11/13/19 1230 (!) 110/59 -- -- 95 (!) 24 100 % --  11/13/19 1215 (!) 113/56 -- -- 96 (!) 30 100 % --  11/13/19 1210 (!) 92/52 -- -- 95 11 100 % --  11/13/19 1209 -- -- -- 87 (!) 22 100 % --  11/13/19 1208 -- -- -- 91 (!) 22 100 % --  11/13/19 1207  -- -- -- 89 18 99 % --  11/13/19 1206 -- -- -- 96 20 99 % --  11/13/19 1205 (!) 98/52 -- -- 93 19 98 % --  11/13/19 1200 (!) 98/52 -- -- 89 20 99 % --  11/13/19 1159 (!) 104/56 (!) 97.3 F (36.3 C) Temporal -- 18 -- --  11/13/19 1146 (!) 108/56 97.6 F (36.4 C) Temporal -- 18 -- --  11/13/19 1145 (!) 108/56 -- -- 89 (!) 23 97 % --  11/13/19 1130 (!) 112/58 -- -- -- (!) 24 -- --  11/13/19 1125 (!) 108/58 -- -- 95 (!) 22 100 % --  11/13/19 1121 (!) 74/48 -- -- 97 15 100 % --  11/13/19 1103 (!) 100/55 -- -- (!) 103 20 100 % --  11/13/19 1030 109/69 -- -- (!) 106 (!) 28 99 % --  11/13/19 1018 -- 97.8 F (36.6 C) Temporal -- -- -- --  11/13/19 1015 (!) 125/49 -- -- (!) 108 20 98 % --  11/13/19 1014 -- -- -- -- -- -- 62.6 kg  11/13/19 1008 118/72 -- -- (!) 102 19 98 % --    Intake/Output Summary (Last 24 hours) at 11/13/2019 1318 Last data filed at 11/13/2019 1159 Gross per 24 hour  Intake 815 ml  Output 500 ml  Net 315 ml    Physical Exam:    General: this is a well-appearing female patient in no acute distress.  Mentating normally  Eyes: sclera anicteric, no redness  ENT: oral mucosa moist without lesions, no cervical or supraclavicular lymphadenopathy  CV: RRR without murmur, S1/S2, no JVD,, no peripheral edema.  Extremities warm and well-perfused with good distal pulses  Resp: clear to auscultation bilaterally, normal RR and effort noted  GI: soft, no tenderness, with active bowel sounds. No guarding or palpable organomegaly noted  Skin; warm and dry, no rash or jaundice noted  Neuro: awake, alert and oriented x 3. Normal gross motor function and fluent speech.  Labs:  CBC Latest Ref Rng & Units 11/13/2019 07/31/2010 07/31/2010  WBC 4.0 - 10.5 K/uL 7.9 - -  Hemoglobin 12.0 - 15.0 g/dL 2.4(Q) 11.9(L) 12.0  Hematocrit 36.0 - 46.0 % 29.9(L) 35.0(L) -  Platelets 150 - 400 K/uL 185 - -    CMP Latest Ref Rng & Units 11/13/2019 07/31/2010 07/29/2010  Glucose 70 - 99 mg/dL  683(M) 86 96  BUN 8 - 23 mg/dL 13 14 14   Creatinine 0.44 - 1.00 mg/dL 0.7 1.96  Sodium 2.22 - 145 mmol/L 139 140 139  Potassium 3.5 - 5.1 mmol/L 3.5 3.2(L) 3.8  Chloride 98 - 111 mmol/L 107 102 101  CO2 22 - 32 mmol/L 23 - 29  Calcium 8.9 - 10.3 mg/dL 8.1(L) - 8.9  Total Protein 6.5 - 8.1 g/dL 979) - -  Total Bilirubin 0.3 - 1.2 mg/dL 0.6 - -  Alkaline Phos 38 - 126 U/L 74 - -  AST 15 - 41 U/L 16 - -  ALT 0 - 44 U/L 13 - -    Recent Labs  Lab 11/13/19 1015  INR 1.1   _________________________________________________________ Radiologic studies:  None today ______________________________________________________ Other studies:   _______________________________________________________ Assessment & Plan  Impression: Hematochezia Acute blood loss anemia   If patient's recollection of previous colonoscopy findings is accurate, then diverticular bleeding appears the most likely cause here.  Significant blood loss so far with transient hypotensive responsive to volume resuscitation.  At the time my evaluation, her blood pressure was 110/80 with a pulse in the low 80s.  Her pulse was regular and strong, she is mentating well and has good distal perfusion.   Plan:  CT angiogram, order was placed earlier by ED physician after the initial consult call to [place].  Every 4 hour hemoglobin hematocrit, transfuse as needed for hemoglobin less than 7.5.  No colonoscopy at this point, as likely to be low yield with this volume of blood in the colon and decreased endoscopic visualization.  Might be necessary if bleeding continues and imaging unrevealing.  We will follow.  Thank you for the courtesy of this consult.  Please contact me with any questions or concerns.  Charlie Pitter III Office: [phone]

## 2019-11-13 NOTE — Sedation Documentation (Signed)
Pt sleeping at this time, vitals are stable. Procedure continues

## 2019-11-13 NOTE — Sedation Documentation (Signed)
Procedure started. Pt is resting at this time with no complaints

## 2019-11-13 NOTE — ED Notes (Signed)
Dr. Katrinka Blazing made aware of 'pt's hypotension.  New orders received for same

## 2019-11-13 NOTE — ED Notes (Signed)
Pt had light amount of bright red bloody stool with clots

## 2019-11-13 NOTE — Consult Note (Signed)
Chief Complaint: GI bleed in ascending color  Referring Physician(s): A. Esterwood PA  Supervising Physician: Ruel Favors  Patient Status: [facility] - In-pt  History of Present Illness: Lindsey Stone is a 64 y.o. female  Presented to this facility with sudden onset of rectal bleeding that started approximately 6 am this morning. Patient has a history of diverticulosis found on colonoscopy and take 325 mg of  ASA daily following hip surgery performed at OSH on 3.30.21. Found to have active extravasation of the ascending colon on CTA.   Team is requesting arteriogram with possible embolization.   Past Medical History:  Diagnosis Date  . Arthritis     Past Surgical History:  Procedure Laterality Date  . HEMORRHOID SURGERY    . right hand surgery    . Right hip replaced      Allergies: Patient has no known allergies.  Medications: Prior to Admission medications   Medication Sig Start Date End Date Taking? Authorizing Provider  estradiol (ESTRACE) 2 MG tablet Take 2 mg by mouth daily. 08/25/19  Yes [provider]  hydrochlorothiazide (HYDRODIURIL) 12.5 MG tablet Take 12.5 mg by mouth daily. 09/08/19  Yes [provider]  ibuprofen (ADVIL) 200 MG tablet Take 200 mg by mouth every 4 (four) hours as needed for pain.   Yes [provider]  OVER THE COUNTER MEDICATION Take 1 tablet by mouth daily as needed. OTC allergy medicine   Yes [provider]  oxyCODONE (OXY IR/ROXICODONE) 5 MG immediate release tablet Take 5-10 mg by mouth every 6 (six) hours as needed for pain. 10/03/19  Yes [provider]  progesterone (PROMETRIUM) 100 MG capsule Take 100 mg by mouth See admin instructions. Take 1 capsule (100mg ) by mouth for 10 days. Every 2 months   Yes [provider]     Family History  Problem Relation Age of Onset  . Hypertension Mother   . Hyperlipidemia Mother   . Hypertension Father   . Hyperlipidemia Father   . Deep vein  thrombosis Father   . Heart disease Father        Required pacemaker    Social History   Socioeconomic History  . Marital status: Married    Spouse name: Not on file  . Number of children: Not on file  . Years of education: Not on file  . Highest education level: Not on file  Occupational History  . Not on file  Tobacco Use  . Smoking status: Former  . Smokeless tobacco: Never Used  . Tobacco comment: quit in 2003  Substance and Sexual Activity  . Alcohol use: Yes  . Drug use: Never  . Sexual activity: Not on file  Other Topics Concern  . Not on file  Social History Narrative  . Not on file   Social Determinants of Health   Financial Resource Strain:   . Difficulty of Paying Living Expenses:   Food Insecurity:   . Worried About 2004 in the Last Year:   . Programme researcher, broadcasting/film/video in the Last Year:   Transportation Needs:   . Barista (Medical):   Freight forwarder Lack of Transportation (Non-Medical):   Physical Activity:   . Days of Exercise per Week:   . Minutes of Exercise per Session:   Stress:   . Feeling of Stress :   Social Connections:   . Frequency of Communication with Friends and Family:   . Frequency of Social Gatherings with Friends  and Family:   . Attends Religious Services:   . Active Member of Clubs or Organizations:   . Attends Banker Meetings:   Marland Kitchen Marital Status:    Review of Systems: A 12 point ROS discussed and pertinent positives are indicated in the HPI above.  All other systems are negative.  Review of Systems  Constitutional: Negative for fatigue and fever.  HENT: Negative for congestion.   Respiratory: Negative for cough and shortness of breath.   Gastrointestinal: Positive for blood in stool and rectal pain. Negative for abdominal pain, diarrhea, nausea and vomiting.    Vital Signs: BP (!) 82/50   Pulse 97   Temp (!) 97.3 F (36.3 C) (Temporal)   Resp (!) 25   Wt 138 lb (62.6 kg)   SpO2 99%    Physical Exam Vitals and nursing note reviewed.  Constitutional:      Appearance: She is well-developed.  HENT:     Head: Normocephalic and atraumatic.  Eyes:     Conjunctiva/sclera: Conjunctivae normal.  Cardiovascular:     Rate and Rhythm: Normal rate and regular rhythm.     Heart sounds: Normal heart sounds.  Pulmonary:     Effort: Pulmonary effort is normal.     Breath sounds: Normal breath sounds.  Musculoskeletal:     Cervical back: Normal range of motion.  Neurological:     Mental Status: She is alert and oriented to person, place, and time.     Imaging: CT Angio Abd/Pel W and/or Wo Contrast  Result Date: 11/13/2019 CLINICAL DATA:  Significant bloody stools this morning. Evaluate for source of GI bleeding. EXAM: CTA ABDOMEN AND PELVIS WITHOUT AND WITH CONTRAST TECHNIQUE: Multidetector CT imaging of the abdomen and pelvis was performed using the standard protocol during bolus administration of intravenous contrast. Multiplanar reconstructed images and MIPs were obtained and reviewed to evaluate the vascular anatomy. CONTRAST:  OMNIPAQUE IOHEXOL 350 MG/ML SOLN COMPARISON:  None. FINDINGS: VASCULAR Aorta: Normal caliber of the abdominal aorta. No evidence of abdominal aortic dissection or periaortic stranding. Celiac: Widely patent without a hemodynamically significant narrowing. Mild ectasia of the splenic artery at the level of the hilum measuring approximately 0.8 cm in diameter (coronal image 69, series 5), of doubtful clinical concern. SMA: Widely patent without a hemodynamically significant narrowing. The distal tributaries of the SMA appear widely patent without discrete intraluminal filling defect to suggest distal embolism. Pooling intraluminal contrast extravasation is seen within the mid ascending colon via a tertiary branch/arcade of the right colic artery. Renals: Solitary bilaterally; widely patent without hemodynamically significant narrowing. No vessel  irregularity to suggest FMD. IMA: Widely patent without a hemodynamically significant narrowing. Inflow: The bilateral common, external and internal iliac arteries are of normal caliber and widely patent without hemodynamically significant narrowing. Proximal Outflow: The bilateral common and imaged portions of the bilateral deep and superficial femoral arteries are widely patent without hemodynamically significant narrowing. Veins: The IVC and pelvic venous systems appear widely patent. Note is made of a retroaortic left renal vein. Review of the MIP images confirms the above findings. _________________________________________________________ NON-VASCULAR Lower chest: Limited visualization of the lower thorax demonstrates minimal dependent subpleural ground-glass atelectasis. No discrete focal airspace opacities. No pleural effusion Normal heart size.  No pericardial effusion. Hepatobiliary: Normal hepatic contour. 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 Normal appearance  of the gallbladder. No radiopaque gallstones. No intra or extrahepatic biliary ductal dilatation. No ascites. Pancreas: Normal appearance of the pancreas. Spleen: Normal appearance of the spleen. Note is made of a small splenule about the anterior tip of the spleen. Adrenals/Urinary Tract: Review of the precontrast images demonstrate excretion of contrast within the bilateral renal collecting systems. No definite renal stones. No discrete renal lesions. No urine obstruction or perinephric stranding. Normal appearance the bilateral adrenal glands. Normal appearance of the urinary bladder given degree of distention. Stomach/Bowel: There is pooling of intraluminal contrast within the mid aspect of the ascending colon (axial image 67, series 6; image 41, series 12; coronal image 44,  series 6), compatible with an area of active lower GI bleeding. Rather extensive colonic diverticulosis is seen throughout the colon, including at the location of the active GI bleeding. No discrete colonic mass or evidence of enteric obstruction. Normal appearance of the terminal ileum and the retrocecal appendix. No pneumoperitoneum, pneumatosis or portal venous gas. Lymphatic: While there is no bulky retroperitoneal, mesenteric, pelvic or inguinal lymphadenopathy, note is made of an approximately 2.3 x 1.9 x 1.9 cm well-defined nonenhancing nodule within the midline of the upper abdominal mesentery (image 52, series 6; image 31, series 12; coronal image 28, series 8). Reproductive: Mildly enlarged but otherwise normal-appearing uterus. No discrete adnexal lesion. No free fluid in the pelvic cul-de-sac. Other: Regional soft tissues appear normal. Musculoskeletal: No acute or aggressive osseous abnormalities. Mild-to-moderate multilevel lumbar spine DDD, worse at L4-L5 and to a lesser extent, L2-L3 with disc space height loss, endplate irregularity and sclerosis. Post right total hip replacement with age-indeterminate ill-defined lucency surrounding the acetabular component. 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 IMPRESSION: 1. 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 2. Normal caliber of the abdominal aorta without significant atherosclerotic plaque. 3. 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 Comparison with prior outside examinations (if available), is advised. Otherwise, follow-up multiphase CT scan in 3 months could be performed  for further evaluation as indicated. 4. Post right total hip replacement with age-indeterminate ill-defined lucency surrounding the acetabular component, nonspecific though could be seen in the setting hardware loosening. Clinical correlation is advised. 5. Mild to moderate multilevel lumbar spine DDD. 6. 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 Critical Value/emergent results were called by telephone at the time of interpretation on 11/13/2019 at 4:17 pm to provider Dr. Loletha Carrow, who verbally acknowledged these results. Electronically Signed   By: Sandi Mariscal M.D.   On: 11/13/2019 16:42    Labs:  CBC: Recent Labs    11/13/19 1015  WBC 7.9  HGB 9.5*  HCT 29.9*  PLT 185    COAGS: Recent Labs    11/13/19 1015  INR 1.1    BMP: Recent Labs    11/13/19 1015  NA 139  K 3.5  CL 107  CO2 23  GLUCOSE 105*  BUN 13  CALCIUM 8.1*  CREATININE 0.72  GFRNONAA >60  GFRAA >60    LIVER FUNCTION TESTS: Recent Labs    11/13/19 1015  BILITOT 0.6  AST 16  ALT 13  ALKPHOS 74  PROT 5.4*  ALBUMIN 2.6*    TUMOR MARKERS: No results for input(s): AFPTM, CEA,  CA199, CHROMGRNA in the last 8760 hours.  Assessment and Plan:  64 y.o, female inpatient.resented to this facility with sudden onset of rectal bleeding that started approximately 6 am this morning. Patient has a history of diverticulosis found on colonoscopy and take 325 mg of  ASA daily following hip surgery performed at OSH on 3.30.21. Found to have active extravasation of the ascending colon on CTA.   Team is requesting arteriogram with possible embolization. Per note from Dr. Myrtie Neither dated 5.10.21 No colonoscopy at this point, as likely to be low yield with this volume of blood in the colon and decreased endoscopic visualization  Pertinent Imaging 5.10.21 - CTA reads Examination is positive for active lower GI bleeding involving the mid aspect of the ascending colon supplied via a tertiary branch/arcade of right colic artery  likely secondary diverticular Disease.   Pertinent IR History none  Pertinent Allergies NKDA   Hgb 9.5 from 10:15 All labs and medications are within acceptable parameters.  Patient is afebrile  Risks and benefits of arteriogram possible embolization were discussed with the patient including, but not limited to bleeding, infection, vascular injury or contrast induced renal failure.  This interventional procedure involves the use of X-rays and because of the nature of the planned procedure, it is possible that we will have prolonged use of X-ray fluoroscopy.  Potential radiation risks to you include (but are not limited to) the following: - A slightly elevated risk for cancer  several years later in life. This risk is typically less than 0.5% percent. This risk is low in comparison to the normal incidence of human cancer, which is 33% for women and 50% for men according to the American Cancer Society. - Radiation induced injury can include skin redness, resembling a rash, tissue breakdown / ulcers and hair loss (which can be temporary or permanent).   The likelihood of either of these occurring depends on the difficulty of the procedure and whether you are sensitive to radiation due to previous procedures, disease, or genetic conditions.   IF your procedure requires a prolonged use of radiation, you will be notified and given written instructions for further action.  It is your responsibility to monitor the irradiated area for the 2 weeks following the procedure and to notify your physician if you are concerned that you have suffered a radiation induced injury.    All of the patient's questions were answered, patient is agreeable to proceed.  Consent signed and in IR Control room   Thank you for this interesting consult.  I greatly enjoyed meeting Lindsey Stone and look forward to participating in their care.  A copy of this report was sent to the requesting provider on this  date.  Electronically Signed: Marletta Lor, NP 11/13/2019, 5:04 PM   I spent a total of 40 Minutes    in face to face in clinical consultation, greater than 50% of which was counseling/coordinating care for arteriogram possible embolization

## 2019-11-13 NOTE — ED Triage Notes (Signed)
Pt in from doc's office via [facility] for GIB. Pt states she started to have black, tarry stools early this am. She's been dealing with cold x 1 wk, felt more dizzy this am, then the black stool began. She went to her doc this am, became dizzy and diaphoretic when she sat on the commode. Had a BRB stool PTA, about loss. Given 1/2L NS en route. VSS, was HR 130's before fluids

## 2019-11-13 NOTE — Sedation Documentation (Signed)
Vital signs stable. Pt able to follow directions appropriately

## 2019-11-13 NOTE — Sedation Documentation (Signed)
Patient is resting comfortably, asleep.

## 2019-11-13 NOTE — ED Notes (Addendum)
Pt had another two BRB bloody stools. MD to order 523ml's and 1 U PRBC, non-emergent at this time

## 2019-11-13 NOTE — ED Notes (Signed)
Dressing at right groin remains dry with no signs of infection.  Strong pedal pulse present

## 2019-11-13 NOTE — Sedation Documentation (Signed)
Vital signs stable. No complaints from pt at this time. Pt did ask that I remove a blanket from her chest because she is feeling claustrophobic.

## 2019-11-13 NOTE — ED Notes (Signed)
Pt returned from IR.  Pt alert and oriented.  Injection site at right groin has a dry dressing applied.  Dressing clean and dry without any signs of bleeding at this time

## 2019-11-13 NOTE — ED Notes (Signed)
Pt taken to CT.

## 2019-11-14 DIAGNOSIS — K625 Hemorrhage of anus and rectum: Secondary | ICD-10-CM | POA: Diagnosis present

## 2019-11-14 DIAGNOSIS — Z79899 Other long term (current) drug therapy: Secondary | ICD-10-CM | POA: Diagnosis not present

## 2019-11-14 DIAGNOSIS — K921 Melena: Secondary | ICD-10-CM | POA: Diagnosis not present

## 2019-11-14 DIAGNOSIS — Z83438 Family history of other disorder of lipoprotein metabolism and other lipidemia: Secondary | ICD-10-CM | POA: Diagnosis not present

## 2019-11-14 DIAGNOSIS — T80818A Extravasation of other vesicant agent, initial encounter: Secondary | ICD-10-CM | POA: Diagnosis present

## 2019-11-14 DIAGNOSIS — D62 Acute posthemorrhagic anemia: Secondary | ICD-10-CM | POA: Diagnosis present

## 2019-11-14 DIAGNOSIS — Z8249 Family history of ischemic heart disease and other diseases of the circulatory system: Secondary | ICD-10-CM | POA: Diagnosis not present

## 2019-11-14 DIAGNOSIS — K7689 Other specified diseases of liver: Secondary | ICD-10-CM | POA: Diagnosis present

## 2019-11-14 DIAGNOSIS — I10 Essential (primary) hypertension: Secondary | ICD-10-CM | POA: Diagnosis present

## 2019-11-14 DIAGNOSIS — K5731 Diverticulosis of large intestine without perforation or abscess with bleeding: Secondary | ICD-10-CM | POA: Diagnosis present

## 2019-11-14 DIAGNOSIS — K922 Gastrointestinal hemorrhage, unspecified: Secondary | ICD-10-CM | POA: Diagnosis not present

## 2019-11-14 DIAGNOSIS — M5136 Other intervertebral disc degeneration, lumbar region: Secondary | ICD-10-CM | POA: Diagnosis present

## 2019-11-14 DIAGNOSIS — Z20822 Contact with and (suspected) exposure to covid-19: Secondary | ICD-10-CM | POA: Diagnosis present

## 2019-11-14 DIAGNOSIS — Z96641 Presence of right artificial hip joint: Secondary | ICD-10-CM | POA: Diagnosis present

## 2019-11-14 DIAGNOSIS — Z791 Long term (current) use of non-steroidal anti-inflammatories (NSAID): Secondary | ICD-10-CM | POA: Diagnosis not present

## 2019-11-14 DIAGNOSIS — E876 Hypokalemia: Secondary | ICD-10-CM | POA: Diagnosis not present

## 2019-11-14 DIAGNOSIS — Z79891 Long term (current) use of opiate analgesic: Secondary | ICD-10-CM | POA: Diagnosis not present

## 2019-11-14 DIAGNOSIS — M25552 Pain in left hip: Secondary | ICD-10-CM | POA: Diagnosis present

## 2019-11-14 DIAGNOSIS — M1990 Unspecified osteoarthritis, unspecified site: Secondary | ICD-10-CM | POA: Diagnosis present

## 2019-11-14 DIAGNOSIS — I959 Hypotension, unspecified: Secondary | ICD-10-CM | POA: Diagnosis present

## 2019-11-14 DIAGNOSIS — Z87891 Personal history of nicotine dependence: Secondary | ICD-10-CM | POA: Diagnosis not present

## 2019-11-14 DIAGNOSIS — Z7982 Long term (current) use of aspirin: Secondary | ICD-10-CM | POA: Diagnosis not present

## 2019-11-14 LAB — CBC
HCT: 20.6 % — ABNORMAL LOW (ref 36.0–46.0)
HCT: 23.3 % — ABNORMAL LOW (ref 36.0–46.0)
Hemoglobin: 6.6 g/dL — CL (ref 12.0–15.0)
Hemoglobin: 7.6 g/dL — ABNORMAL LOW (ref 12.0–15.0)
MCH: 29.7 pg (ref 26.0–34.0)
MCH: 29.8 pg (ref 26.0–34.0)
MCHC: 32 g/dL (ref 30.0–36.0)
MCHC: 32.6 g/dL (ref 30.0–36.0)
MCV: 92.8 fL (ref 80.0–100.0)
MCV: 91.4 fL (ref 80.0–100.0)
Platelets: 158 10*3/uL (ref 150–400)
Platelets: 142 10*3/uL — ABNORMAL LOW (ref 150–400)
RBC: 2.22 MIL/uL — ABNORMAL LOW (ref 3.87–5.11)
RBC: 2.55 MIL/uL — ABNORMAL LOW (ref 3.87–5.11)
RDW: 13.4 % (ref 11.5–15.5)
RDW: 13.6 % (ref 11.5–15.5)
WBC: 8.3 10*3/uL (ref 4.0–10.5)
WBC: 7.6 10*3/uL (ref 4.0–10.5)
nRBC: 0 % (ref 0.0–0.2)
nRBC: 0 % (ref 0.0–0.2)

## 2019-11-14 LAB — HIV ANTIBODY (ROUTINE TESTING W REFLEX): HIV Screen 4th Generation wRfx: NONREACTIVE

## 2019-11-14 LAB — BASIC METABOLIC PANEL
Anion gap: 5 (ref 5–15)
BUN: 7 mg/dL — ABNORMAL LOW (ref 8–23)
CO2: 26 mmol/L (ref 22–32)
Calcium: 7.2 mg/dL — ABNORMAL LOW (ref 8.9–10.3)
Chloride: 109 mmol/L (ref 98–111)
Creatinine, Ser: 0.6 mg/dL (ref 0.44–1.00)
GFR calc Af Amer: >60 mL/min (ref >60)
GFR calc non Af Amer: >60 mL/min (ref >60)
Glucose, Bld: 117 mg/dL — ABNORMAL HIGH (ref 70–99)
Potassium: 3.4 mmol/L — ABNORMAL LOW (ref 3.5–5.1)
Sodium: 140 mmol/L (ref 135–145)

## 2019-11-14 LAB — HEMOGLOBIN AND HEMATOCRIT, BLOOD
HCT: 22.7 % — ABNORMAL LOW (ref 36.0–46.0)
Hemoglobin: 7.5 g/dL — ABNORMAL LOW (ref 12.0–15.0)

## 2019-11-14 LAB — PREPARE RBC (CROSSMATCH)

## 2019-11-14 MED ORDER — SODIUM CHLORIDE 0.9% IV SOLUTION: Freq: Once | INTRAVENOUS | Status: AC

## 2019-11-14 NOTE — Progress Notes (Addendum)
Supervising Physician: Gilmer Mor  Patient Status:  [facility] - In-pt  Chief Complaint:  GI Bleed  Subjective: 64 y.o, female inpatient. Presented to this facility with sudden onset of rectal bleeding. Patient has a history of diverticulosis found on colonoscopy and recent 325mg  ASA usage s/p hip replacement surgery. IR performed an arteriogram with embolization of right colic artery on 5.10.21. Patient alert and laying in bed, calm and comfortable. Denies any fevers, headache, chest pain, SOB, cough, abdominal pain, nausea, vomiting. Patient states she has a decrease in hematochezia      Allergies: Patient has no known allergies.  Medications: Prior to Admission medications   Medication Sig Start Date End Date Taking? Authorizing Provider  estradiol (ESTRACE) 2 MG tablet Take 2 mg by mouth daily. 08/25/19  Yes [provider]  hydrochlorothiazide (HYDRODIURIL) 12.5 MG tablet Take 12.5 mg by mouth daily. 09/08/19  Yes [provider]  ibuprofen (ADVIL) 200 MG tablet Take 200 mg by mouth every 4 (four) hours as needed for pain.   Yes [provider]  OVER THE COUNTER MEDICATION Take 1 tablet by mouth daily as needed. OTC allergy medicine   Yes [provider]  oxyCODONE (OXY IR/ROXICODONE) 5 MG immediate release tablet Take 5-10 mg by mouth every 6 (six) hours as needed for pain. 10/03/19  Yes [provider]  progesterone (PROMETRIUM) 100 MG capsule Take 100 mg by mouth See admin instructions. Take 1 capsule (100mg ) by mouth for 10 days. Every 2 months   Yes [provider]     Vital Signs: BP (!) 118/54 (BP Location: Right Arm)   Pulse 91   Temp 98.3 F (36.8 C) (Oral)   Resp 19   Ht 5\' 3"  (1.6 m)   Wt 139 lb 1.8 oz (63.1 kg)   SpO2 95%   BMI 24.64 kg/m   Physical Exam Vitals and nursing note reviewed.  Constitutional:      Appearance: She is well-developed. She is ill-appearing.  HENT:     Head: Normocephalic and  atraumatic.  Eyes:     Conjunctiva/sclera: Conjunctivae normal.  Pulmonary:     Effort: Pulmonary effort is normal.  Musculoskeletal:     Cervical back: Normal range of motion.  Skin:    Findings: No lesion.     Comments: Site is soft with no active bleeding and no appreciable pseudoaneurysm. Dressing is C/D/I   Neurological:     Mental Status: She is alert and oriented to person, place, and time.     Imaging: IR Angiogram Visceral Selective  Result Date: 11/14/2019 INDICATION: Acute lower GI bleeding, positive CTA for right colon active arterial bleeding. EXAM: Ultrasound guidance for vascular access SMA catheterization and angiograms Ileocolic and right colic peripheral micro catheterization and angiograms Peripheral right colic micro catheterization, angiogram, and micro coil embolization for active bleeding. MEDICATIONS: 1% lidocaine local. ANESTHESIA/SEDATION: Moderate (conscious) sedation was employed during this procedure. A total of Versed 2.0 mg and Fentanyl 50 mcg was administered intravenously. Moderate Sedation Time: 74 minutes. The patient's level of consciousness and vital signs were monitored continuously by radiology nursing throughout the procedure under my direct supervision. CONTRAST:  125 cc omni 300 FLUOROSCOPY TIME:  Fluoroscopy Time: 22 minutes 48 seconds (343 mGy). COMPLICATIONS: None immediate. PROCEDURE: Informed consent was obtained from the patient following explanation of the procedure, risks, benefits and alternatives. The patient understands, agrees and consents for the procedure. All questions were addressed. A time out was performed prior to the  initiation of the procedure. Maximal barrier sterile technique utilized including caps, mask, sterile gowns, sterile gloves, large sterile drape, hand hygiene, and Betadine prep. Under sterile conditions and local anesthesia, ultrasound micropuncture access performed of the patent right common femoral artery. Images  obtained for documentation of the patent right common femoral artery. Five French sheath inserted over a guidewire. Initially, a C2 catheter over a Bentson guidewire was utilized to select the SMA. SMA angiogram performed. SMA: SMA is widely patent including the jejunal, colic and ileocolic branches. Suspicious hypervascularity in the right colon along the peripheral aspect of the right colic artery and marginal arteries. Repeat SMA angiogram performed with a power injection confirming active contrast extravasation/colonic bleeding from a small peripheral right colic branch in the right colon. This correlates with the CTA. The C2 catheter exchange for a chung 2.5 catheter. This was utilized to resect the SMA origin for better purchase. Additional SMA angiogram confirms active bleeding in the right colon from a small peripheral branch from the right colic artery. Through the [facility] 2.5 catheter, a Renegade STC catheter over a fathom guidewire was advanced into the main SMA trunk. Catheter was advanced into the ileocolic branch. Ileocolic angiogram performed. Ileocolic: Ileocolic and right colic branches are patent. There are hypervascular branches from the right iliac colic and marginal artery territory. Active contrast extravasation has temporarily stopped. Microcatheter was advanced into the right colic artery. Branch of the right colic artery was catheterized. Tortuous hypervascularity noted within this vascular territory which correlates with the area of active bleeding by prior angiograms and CTA. For embolization, 2 2 mm microcoils were deployed within the peripheral right colic branch. Following this, follow-up peripheral right colic angiogram through the microcatheter demonstrates no further active bleeding or hypervascularity. This was repeated after approximately 5 minutes and there was no further active arterial bleeding in the right colon or hypervascularity demonstrated. Final repeat SMA angiogram again  demonstrates no further active bleeding in the right colic vascular territory or any residual hypervascularity. Access removed. Assist and excessive device. No immediate complication. Patient tolerated the procedure well. IMPRESSION: Positive angiogram for active arterial bleeding from a small peripheral branch of the right colic artery into the right colon. This correlates with the CTA. Successful peripheral right colic artery micro coil embolization at the bleeding site as above. Electronically Signed   By: Judie Petit.  Shick M.D.   On: 11/14/2019 08:53   IR Angiogram Selective Each Additional Vessel  Result Date: 11/14/2019 INDICATION: Acute lower GI bleeding, positive CTA for right colon active arterial bleeding. EXAM: Ultrasound guidance for vascular access SMA catheterization and angiograms Ileocolic and right colic peripheral micro catheterization and angiograms Peripheral right colic micro catheterization, angiogram, and micro coil embolization for active bleeding. MEDICATIONS: 1% lidocaine local. ANESTHESIA/SEDATION: Moderate (conscious) sedation was employed during this procedure. A total of Versed 2.0 mg and Fentanyl 50 mcg was administered intravenously. Moderate Sedation Time: 74 minutes. The patient's level of consciousness and vital signs were monitored continuously by radiology nursing throughout the procedure under my direct supervision. CONTRAST:  125 cc omni 300 FLUOROSCOPY TIME:  Fluoroscopy Time: 22 minutes 48 seconds (343 mGy). COMPLICATIONS: None immediate. PROCEDURE: Informed consent was obtained from the patient following explanation of the procedure, risks, benefits and alternatives. The patient understands, agrees and consents for the procedure. All questions were addressed. A time out was performed prior to the initiation of the procedure. Maximal barrier sterile technique utilized including caps, mask, sterile gowns, sterile gloves, large sterile drape, hand hygiene, and  Betadine prep. Under  sterile conditions and local anesthesia, ultrasound micropuncture access performed of the patent right common femoral artery. Images obtained for documentation of the patent right common femoral artery. Five French sheath inserted over a guidewire. Initially, a C2 catheter over a Bentson guidewire was utilized to select the SMA. SMA angiogram performed. SMA: SMA is widely patent including the jejunal, colic and ileocolic branches. Suspicious hypervascularity in the right colon along the peripheral aspect of the right colic artery and marginal arteries. Repeat SMA angiogram performed with a power injection confirming active contrast extravasation/colonic bleeding from a small peripheral right colic branch in the right colon. This correlates with the CTA. The C2 catheter exchange for a chung 2.5 catheter. This was utilized to resect the SMA origin for better purchase. Additional SMA angiogram confirms active bleeding in the right colon from a small peripheral branch from the right colic artery. Through the [facility] 2.5 catheter, a Renegade STC catheter over a fathom guidewire was advanced into the main SMA trunk. Catheter was advanced into the ileocolic branch. Ileocolic angiogram performed. Ileocolic: Ileocolic and right colic branches are patent. There are hypervascular branches from the right iliac colic and marginal artery territory. Active contrast extravasation has temporarily stopped. Microcatheter was advanced into the right colic artery. Branch of the right colic artery was catheterized. Tortuous hypervascularity noted within this vascular territory which correlates with the area of active bleeding by prior angiograms and CTA. For embolization, 2 2 mm microcoils were deployed within the peripheral right colic branch. Following this, follow-up peripheral right colic angiogram through the microcatheter demonstrates no further active bleeding or hypervascularity. This was repeated after approximately 5 minutes and  there was no further active arterial bleeding in the right colon or hypervascularity demonstrated. Final repeat SMA angiogram again demonstrates no further active bleeding in the right colic vascular territory or any residual hypervascularity. Access removed. Assist and excessive device. No immediate complication. Patient tolerated the procedure well. IMPRESSION: Positive angiogram for active arterial bleeding from a small peripheral branch of the right colic artery into the right colon. This correlates with the CTA. Successful peripheral right colic artery micro coil embolization at the bleeding site as above. Electronically Signed   By: Jerilynn Mages.  Shick M.D.   On: 11/14/2019 08:53   IR US Guide Vasc Access Right  Result Date: 11/14/2019 INDICATION: Acute lower GI bleeding, positive CTA for right colon active arterial bleeding. EXAM: Ultrasound guidance for vascular access SMA catheterization and angiograms Ileocolic and right colic peripheral micro catheterization and angiograms Peripheral right colic micro catheterization, angiogram, and micro coil embolization for active bleeding. MEDICATIONS: 1% lidocaine local. ANESTHESIA/SEDATION: Moderate (conscious) sedation was employed during this procedure. A total of Versed 2.0 mg and Fentanyl 50 mcg was administered intravenously. Moderate Sedation Time: 74 minutes. The patient's level of consciousness and vital signs were monitored continuously by radiology nursing throughout the procedure under my direct supervision. CONTRAST:  125 cc omni 300 FLUOROSCOPY TIME:  Fluoroscopy Time: 22 minutes 48 seconds (343 mGy). COMPLICATIONS: None immediate. PROCEDURE: Informed consent was obtained from the patient following explanation of the procedure, risks, benefits and alternatives. The patient understands, agrees and consents for the procedure. All questions were addressed. A time out was performed prior to the initiation of the procedure. Maximal barrier sterile technique  utilized including caps, mask, sterile gowns, sterile gloves, large sterile drape, hand hygiene, and Betadine prep. Under sterile conditions and local anesthesia, ultrasound micropuncture access performed of the patent right common femoral artery. Images obtained  for documentation of the patent right common femoral artery. Five French sheath inserted over a guidewire. Initially, a C2 catheter over a Bentson guidewire was utilized to select the SMA. SMA angiogram performed. SMA: SMA is widely patent including the jejunal, colic and ileocolic branches. Suspicious hypervascularity in the right colon along the peripheral aspect of the right colic artery and marginal arteries. Repeat SMA angiogram performed with a power injection confirming active contrast extravasation/colonic bleeding from a small peripheral right colic branch in the right colon. This correlates with the CTA. The C2 catheter exchange for a chung 2.5 catheter. This was utilized to resect the SMA origin for better purchase. Additional SMA angiogram confirms active bleeding in the right colon from a small peripheral branch from the right colic artery. Through the [facility] 2.5 catheter, a Renegade STC catheter over a fathom guidewire was advanced into the main SMA trunk. Catheter was advanced into the ileocolic branch. Ileocolic angiogram performed. Ileocolic: Ileocolic and right colic branches are patent. There are hypervascular branches from the right iliac colic and marginal artery territory. Active contrast extravasation has temporarily stopped. Microcatheter was advanced into the right colic artery. Branch of the right colic artery was catheterized. Tortuous hypervascularity noted within this vascular territory which correlates with the area of active bleeding by prior angiograms and CTA. For embolization, 2 2 mm microcoils were deployed within the peripheral right colic branch. Following this, follow-up peripheral right colic angiogram through the  microcatheter demonstrates no further active bleeding or hypervascularity. This was repeated after approximately 5 minutes and there was no further active arterial bleeding in the right colon or hypervascularity demonstrated. Final repeat SMA angiogram again demonstrates no further active bleeding in the right colic vascular territory or any residual hypervascularity. Access removed. Assist and excessive device. No immediate complication. Patient tolerated the procedure well. IMPRESSION: Positive angiogram for active arterial bleeding from a small peripheral branch of the right colic artery into the right colon. This correlates with the CTA. Successful peripheral right colic artery micro coil embolization at the bleeding site as above. Electronically Signed   By: Judie Petit.  Shick M.D.   On: 11/14/2019 08:53   IR EMBO ART  VEN HEMORR LYMPH EXTRAV  INC GUIDE ROADMAPPING  Result Date: 11/14/2019 INDICATION: Acute lower GI bleeding, positive CTA for right colon active arterial bleeding. EXAM: Ultrasound guidance for vascular access SMA catheterization and angiograms Ileocolic and right colic peripheral micro catheterization and angiograms Peripheral right colic micro catheterization, angiogram, and micro coil embolization for active bleeding. MEDICATIONS: 1% lidocaine local. ANESTHESIA/SEDATION: Moderate (conscious) sedation was employed during this procedure. A total of Versed 2.0 mg and Fentanyl 50 mcg was administered intravenously. Moderate Sedation Time: 74 minutes. The patient's level of consciousness and vital signs were monitored continuously by radiology nursing throughout the procedure under my direct supervision. CONTRAST:  125 cc omni 300 FLUOROSCOPY TIME:  Fluoroscopy Time: 22 minutes 48 seconds (343 mGy). COMPLICATIONS: None immediate. PROCEDURE: Informed consent was obtained from the patient following explanation of the procedure, risks, benefits and alternatives. The patient understands, agrees and consents  for the procedure. All questions were addressed. A time out was performed prior to the initiation of the procedure. Maximal barrier sterile technique utilized including caps, mask, sterile gowns, sterile gloves, large sterile drape, hand hygiene, and Betadine prep. Under sterile conditions and local anesthesia, ultrasound micropuncture access performed of the patent right common femoral artery. Images obtained for documentation of the patent right common femoral artery. Five French sheath inserted over a guidewire.  Initially, a C2 catheter over a Bentson guidewire was utilized to select the SMA. SMA angiogram performed. SMA: SMA is widely patent including the jejunal, colic and ileocolic branches. Suspicious hypervascularity in the right colon along the peripheral aspect of the right colic artery and marginal arteries. Repeat SMA angiogram performed with a power injection confirming active contrast extravasation/colonic bleeding from a small peripheral right colic branch in the right colon. This correlates with the CTA. The C2 catheter exchange for a chung 2.5 catheter. This was utilized to resect the SMA origin for better purchase. Additional SMA angiogram confirms active bleeding in the right colon from a small peripheral branch from the right colic artery. Through the [facility] 2.5 catheter, a Renegade STC catheter over a fathom guidewire was advanced into the main SMA trunk. Catheter was advanced into the ileocolic branch. Ileocolic angiogram performed. Ileocolic: Ileocolic and right colic branches are patent. There are hypervascular branches from the right iliac colic and marginal artery territory. Active contrast extravasation has temporarily stopped. Microcatheter was advanced into the right colic artery. Branch of the right colic artery was catheterized. Tortuous hypervascularity noted within this vascular territory which correlates with the area of active bleeding by prior angiograms and CTA. For embolization,  2 2 mm microcoils were deployed within the peripheral right colic branch. Following this, follow-up peripheral right colic angiogram through the microcatheter demonstrates no further active bleeding or hypervascularity. This was repeated after approximately 5 minutes and there was no further active arterial bleeding in the right colon or hypervascularity demonstrated. Final repeat SMA angiogram again demonstrates no further active bleeding in the right colic vascular territory or any residual hypervascularity. Access removed. Assist and excessive device. No immediate complication. Patient tolerated the procedure well. IMPRESSION: Positive angiogram for active arterial bleeding from a small peripheral branch of the right colic artery into the right colon. This correlates with the CTA. Successful peripheral right colic artery micro coil embolization at the bleeding site as above. Electronically Signed   By: Judie Petit.  Shick M.D.   On: 11/14/2019 08:53   CT Angio Abd/Pel W and/or Wo Contrast  Result Date: 11/13/2019 CLINICAL DATA:  Significant bloody stools this morning. Evaluate for source of GI bleeding. EXAM: CTA ABDOMEN AND PELVIS WITHOUT AND WITH CONTRAST TECHNIQUE: Multidetector CT imaging of the abdomen and pelvis was performed using the standard protocol during bolus administration of intravenous contrast. Multiplanar reconstructed images and MIPs were obtained and reviewed to evaluate the vascular anatomy. CONTRAST:  OMNIPAQUE IOHEXOL 350 MG/ML SOLN COMPARISON:  None. FINDINGS: VASCULAR Aorta: Normal caliber of the abdominal aorta. No evidence of abdominal aortic dissection or periaortic stranding. Celiac: Widely patent without a hemodynamically significant narrowing. Mild ectasia of the splenic artery at the level of the hilum measuring approximately 0.8 cm in diameter (coronal image 69, series 5), of doubtful clinical concern. SMA: Widely patent without a hemodynamically significant narrowing. The distal  tributaries of the SMA appear widely patent without discrete intraluminal filling defect to suggest distal embolism. Pooling intraluminal contrast extravasation is seen within the mid ascending colon via a tertiary branch/arcade of the right colic artery. Renals: Solitary bilaterally; widely patent without hemodynamically significant narrowing. No vessel irregularity to suggest FMD. IMA: Widely patent without a hemodynamically significant narrowing. Inflow: The bilateral common, external and internal iliac arteries are of normal caliber and widely patent without hemodynamically significant narrowing. Proximal Outflow: The bilateral common and imaged portions of the bilateral deep and superficial femoral arteries are widely patent without hemodynamically significant narrowing. Veins:  The IVC and pelvic venous systems appear widely patent. Note is made of a retroaortic left renal vein. Review of the MIP images confirms the above findings. _________________________________________________________ NON-VASCULAR Lower chest: Limited visualization of the lower thorax demonstrates minimal dependent subpleural ground-glass atelectasis. No discrete focal airspace opacities. No pleural effusion Normal heart size.  No pericardial effusion. Hepatobiliary: Normal hepatic contour. 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 Normal appearance of the gallbladder. No radiopaque gallstones. No intra or extrahepatic biliary ductal dilatation. No ascites. Pancreas: Normal appearance of the pancreas. Spleen: Normal appearance of the spleen. Note is made of a small splenule about the anterior tip of the spleen. Adrenals/Urinary Tract: Review of the precontrast images demonstrate excretion of contrast within the bilateral renal collecting systems. No definite renal stones. No  discrete renal lesions. No urine obstruction or perinephric stranding. Normal appearance the bilateral adrenal glands. Normal appearance of the urinary bladder given degree of distention. Stomach/Bowel: There is pooling of intraluminal contrast within the mid aspect of the ascending colon (axial image 67, series 6; image 41, series 12; coronal image 44, series 6), compatible with an area of active lower GI bleeding. Rather extensive colonic diverticulosis is seen throughout the colon, including at the location of the active GI bleeding. No discrete colonic mass or evidence of enteric obstruction. Normal appearance of the terminal ileum and the retrocecal appendix. No pneumoperitoneum, pneumatosis or portal venous gas. Lymphatic: While there is no bulky retroperitoneal, mesenteric, pelvic or inguinal lymphadenopathy, note is made of an approximately 2.3 x 1.9 x 1.9 cm well-defined nonenhancing nodule within the midline of the upper abdominal mesentery (image 52, series 6; image 31, series 12; coronal image 28, series 8). Reproductive: Mildly enlarged but otherwise normal-appearing uterus. No discrete adnexal lesion. No free fluid in the pelvic cul-de-sac. Other: Regional soft tissues appear normal. Musculoskeletal: No acute or aggressive osseous abnormalities. Mild-to-moderate multilevel lumbar spine DDD, worse at L4-L5 and to a lesser extent, L2-L3 with disc space height loss, endplate irregularity and sclerosis. Post right total hip replacement with age-indeterminate ill-defined lucency surrounding the acetabular component. 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 IMPRESSION: 1. 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 2. Normal caliber of the abdominal aorta without significant  atherosclerotic plaque. 3. 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 Comparison with prior outside examinations (if available), is advised. Otherwise, follow-up multiphase CT scan in 3 months could be performed for further evaluation as indicated. 4. Post right total hip replacement with age-indeterminate ill-defined lucency surrounding the acetabular component, nonspecific though could be seen in the setting hardware loosening. Clinical correlation is advised. 5. Mild to moderate multilevel lumbar spine DDD. 6. 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 Critical Value/emergent results were called by telephone at the time of interpretation on 11/13/2019 at 4:17 pm to provider Dr. Myrtie Neitheranis, who verbally acknowledged these results. Electronically Signed   By: Simonne ComeJohn  Watts M.D.   On: 11/13/2019 16:42    Labs:  CBC: Recent Labs    11/13/19 1015 11/13/19 1015 11/13/19 1700 11/13/19 2126 11/14/19 0336 11/14/19 1146  WBC  7.9  --   --   --  8.3 7.6  HGB 9.5*   < > 8.4* 7.7* 6.6* 7.6*  HCT 29.9*   < > 26.7* 23.8* 20.6* 23.3*  PLT 185  --   --   --  158 142*   < > = values in this interval not displayed.    COAGS: Recent Labs    11/13/19 1015  INR 1.1    BMP: Recent Labs    11/13/19 1015 11/14/19 0336  NA 139 140  K 3.5 3.4*  CL 107 109  CO2 23 26  GLUCOSE 105* 117*  BUN 13 7*  CALCIUM 8.1* 7.2*  CREATININE 0.72 0.60  GFRNONAA >60 >60  GFRAA >60 >60    LIVER FUNCTION TESTS: Recent Labs    11/13/19 1015  BILITOT 0.6  AST 16  ALT 13  ALKPHOS 74  PROT 5.4*  ALBUMIN 2.6*    Assessment and Plan:  64 y.o, female inpatient. Presented to this facility with sudden onset of rectal bleeding. IR performed an arteriogram with embolization of right colic artery on 5.10.21   Pertinent Imaging none  Pertinent IR  History 5.10.21 - Arteriogram with embolization of right colic artery  Pertinent Allergies NKDA  Hgb 7.6 after 1 unit PRBC  All labs and medications are within acceptable parameters.   IR will continue to follow along.   Electronically Signed: Marletta Lor, NP 11/14/2019, 12:55 PM   I spent a total of  at the the patient's bedside AND on the patient's hospital floor or unit, greater than 50% of which was counseling/coordinating care for arteriogram

## 2019-11-14 NOTE — Progress Notes (Signed)
PROGRESS NOTE                                                                                                                                                                                                             Patient Demographics:    Lindsey Stone, is a 64 y.o. female, DOB - 21-Nov-1955, ZOX:[id number]  Admit date - 11/13/2019   Admitting Physician Clydie Braun, MD  Outpatient Primary MD for the patient is Sigmund Hazel, MD  LOS - 0   Chief Complaint  Patient presents with  . Rectal Bleeding       Brief Narrative    Lindsey Stone is a 64 y.o. female with medical history significant of diverticulosis, arthritis, and right hip replacement 5 weeks ago who presents with complaints of rectal bleeding , patient with known history of diverticulosis on colonoscopy, as he recently taking aspirin for DVT prophylaxis s/p hip replacement surgery, status post arteriogram with embolization of right colonic artery on 11/13/2019 by IR, patient with significant drop in her hemoglobin required 2 units PRBC transfusion, please see discussion below.    Subjective:    Lindsey Stone today denies any chest pain, shortness of breath, lightheadedness, reports decrease in the amount of hematochezia .   Assessment  & Plan :    Principal Problem:   Lower GI bleed Active Problems:   Acute blood loss anemia   Arthritis   Diverticulosis   Rectal bleeding   Hematochezia/lower GI bleed -This is secondary to acute right-sided colonic diverticular bleed. -CT abdomen pelvis with contrast significant for active lower GI bleed in the mid aspect of the ascending colon supplied via a tertiary branch/arcade of right colic artery -Status post IR intervention with arteriogram with embolization of right colonic artery on 11/13/2019. -GI input greatly appreciated, given significant GI bleed and acute blood loss anemia, will need to continue monitoring CBC closely to  ensure stabilization of the bleed, for now we will continue with regular diet, will recheck CBC at 6 PM and transfuse for hemoglobin less than 7.  Acute blood loss anemia. -Due to above, she received 2 units PRBC so far, will monitor closely.  Hypertension -Pressure remains soft, continue to hold hydrochlorothiazide   COVID-19 Labs  No results for input(s): DDIMER, FERRITIN, LDH, CRP in the last 72 hours.  Lab Results  Component Value Date   SARSCOV2NAA NEGATIVE 11/13/2019     Code Status : Full  Family Communication  : None at bedside  Disposition Plan  :  Status is: Inpatient  Remains inpatient appropriate because:Hemodynamically unstable and Inpatient level of care appropriate due to severity of illness   Dispo: The patient is from: Home              Anticipated d/c is to: Home              Anticipated d/c date is: 1 day              Patient currently is not medically stable to d/c.         Barriers For Discharge : Symptoms of GI bleed, requiring multiple transfusions, will need close monitoring.  Consults  :  GI, IR  Procedures  : arteriogram with embolization of right colic artery on 5.10.21  DVT Prophylaxis  :  SCD  Lab Results  Component Value Date   PLT 142 (L) 11/14/2019    Antibiotics  :    Anti-infectives (From admission, onward)   None        Objective:   Vitals:   11/14/19 0525 11/14/19 0743 11/14/19 0800 11/14/19 1124  BP: (!) 111/50 (!) 95/50 (!) 102/54 (!) 118/54  Pulse: 95 83 83 91  Resp: 16 18  19   Temp: 97.9 F (36.6 C) 97.6 F (36.4 C)  98.3 F (36.8 C)  TempSrc: Axillary Oral  Oral  SpO2: 98% 98% 98% 95%  Weight:      Height:        Wt Readings from Last 3 Encounters:  11/13/19 63.1 kg     Intake/Output Summary (Last 24 hours) at 11/14/2019 1505 Last data filed at 11/14/2019 1004 Gross per 24 hour  Intake 2380.65 ml  Output 550 ml  Net 1830.65 ml     Physical Exam  Awake Alert, Oriented X 3, No new F.N  deficits, Normal affect Symmetrical Chest wall movement, Good air movement bilaterally, CTAB RRR,No Gallops,Rubs or new Murmurs, No Parasternal Heave +ve B.Sounds, Abd Soft, No tenderness,No rebound - guarding or rigidity. No Cyanosis, Clubbing or edema, No new Rash or bruise      Data Review:    CBC Recent Labs  Lab 11/13/19 1015 11/13/19 1700 11/13/19 2126 11/14/19 0336 11/14/19 1146  WBC 7.9  --   --  8.3 7.6  HGB 9.5* 8.4* 7.7* 6.6* 7.6*  HCT 29.9* 26.7* 23.8* 20.6* 23.3*  PLT 185  --   --  158 142*  MCV 93.7  --   --  92.8 91.4  MCH 29.8  --   --  29.7 29.8  MCHC 31.8  --   --  32.0 32.6  RDW 12.5  --   --  13.4 13.6  LYMPHSABS 0.9  --   --   --   --   MONOABS 0.8  --   --   --   --   EOSABS 0.1  --   --   --   --   BASOSABS 0.1  --   --   --   --     Chemistries  Recent Labs  Lab 11/13/19 1015 11/14/19 0336  NA 139 140  K 3.5 3.4*  CL 107 109  CO2 23 26  GLUCOSE 105* 117*  BUN 13 7*  CREATININE 0.72 0.60  CALCIUM 8.1* 7.2*  AST 16  --   ALT 13  --  ALKPHOS 74  --   BILITOT 0.6  --    ------------------------------------------------------------------------------------------------------------------ No results for input(s): CHOL, HDL, LDLCALC, TRIG, CHOLHDL, LDLDIRECT in the last 72 hours.  No results found for: HGBA1C ------------------------------------------------------------------------------------------------------------------ No results for input(s): TSH, T4TOTAL, T3FREE, THYROIDAB in the last 72 hours.  Invalid input(s): FREET3 ------------------------------------------------------------------------------------------------------------------ No results for input(s): VITAMINB12, FOLATE, FERRITIN, TIBC, IRON, RETICCTPCT in the last 72 hours.  Coagulation profile Recent Labs  Lab 11/13/19 1015  INR 1.1    No results for input(s): DDIMER in the last 72 hours.  Cardiac Enzymes No results for input(s): CKMB, TROPONINI, MYOGLOBIN in the last  168 hours.  Invalid input(s): CK ------------------------------------------------------------------------------------------------------------------ No results found for: BNP  Inpatient Medications  Scheduled Meds: . sodium chloride flush  3 mL Intravenous Q12H   Continuous Infusions: . sodium chloride    . sodium chloride 100 mL/hr at 11/13/19 2209   PRN Meds:.acetaminophen **OR** acetaminophen, albuterol, fentaNYL, lidocaine, midazolam, ondansetron **OR** ondansetron (ZOFRAN) IV  Micro Results Recent Results (from the past 240 hour(s))  SARS Coronavirus 2 by RT PCR (hospital order, performed in [facility] hospital lab) Nasopharyngeal Nasopharyngeal Swab     Status: None   Collection Time: 11/13/19 10:33 AM   Specimen: Nasopharyngeal Swab  Result Value Ref Range Status   SARS Coronavirus 2 NEGATIVE NEGATIVE Final    Comment: (NOTE) SARS-CoV-2 target nucleic acids are NOT DETECTED. The SARS-CoV-2 RNA is generally detectable in upper and lower respiratory specimens during the acute phase of infection. The lowest concentration of SARS-CoV-2 viral copies this assay can detect is 250 copies / mL. A negative result does not preclude SARS-CoV-2 infection and should not be used as the sole basis for treatment or other patient management decisions.  A negative result may occur with improper specimen collection / handling, submission of specimen other than nasopharyngeal swab, presence of viral mutation(s) within the areas targeted by this assay, and inadequate number of viral copies (<250 copies / mL). A negative result must be combined with clinical observations, patient history, and epidemiological information. Fact Sheet for Patients:   BoilerBrush.com.cy Fact Sheet for Healthcare Providers: https://pope.com/ This test is not yet approved or cleared  by the [place] FDA and has been authorized for detection and/or diagnosis of  SARS-CoV-2 by FDA under an Emergency Use Authorization (EUA).  This EUA will remain in effect (meaning this test can be used) for the duration of the COVID-19 declaration under Section 564(b)(1) of the Act, 21 U.S.C. section 360bbb-3(b)(1), unless the authorization is terminated or revoked sooner. Performed at [facility], [address] [place], [place] [address]     Radiology Reports IR Angiogram Visceral Selective  Result Date: 11/14/2019 INDICATION: Acute lower GI bleeding, positive CTA for right colon active arterial bleeding. EXAM: Ultrasound guidance for vascular access SMA catheterization and angiograms Ileocolic and right colic peripheral micro catheterization and angiograms Peripheral right colic micro catheterization, angiogram, and micro coil embolization for active bleeding. MEDICATIONS: 1% lidocaine local. ANESTHESIA/SEDATION: Moderate (conscious) sedation was employed during this procedure. A total of Versed 2.0 mg and Fentanyl 50 mcg was administered intravenously. Moderate Sedation Time: 74 minutes. The patient's level of consciousness and vital signs were monitored continuously by radiology nursing throughout the procedure under my direct supervision. CONTRAST:  125 cc omni 300 FLUOROSCOPY TIME:  Fluoroscopy Time: 22 minutes 48 seconds (343 mGy). COMPLICATIONS: None immediate. PROCEDURE: Informed consent was obtained from the patient following explanation of the procedure, risks, benefits and alternatives. The patient understands,  agrees and consents for the procedure. All questions were addressed. A time out was performed prior to the initiation of the procedure. Maximal barrier sterile technique utilized including caps, mask, sterile gowns, sterile gloves, large sterile drape, hand hygiene, and Betadine prep. Under sterile conditions and local anesthesia, ultrasound micropuncture access performed of the patent right common femoral artery. Images obtained for documentation of  the patent right common femoral artery. Five French sheath inserted over a guidewire. Initially, a C2 catheter over a Bentson guidewire was utilized to select the SMA. SMA angiogram performed. SMA: SMA is widely patent including the jejunal, colic and ileocolic branches. Suspicious hypervascularity in the right colon along the peripheral aspect of the right colic artery and marginal arteries. Repeat SMA angiogram performed with a power injection confirming active contrast extravasation/colonic bleeding from a small peripheral right colic branch in the right colon. This correlates with the CTA. The C2 catheter exchange for a chung 2.5 catheter. This was utilized to resect the SMA origin for better purchase. Additional SMA angiogram confirms active bleeding in the right colon from a small peripheral branch from the right colic artery. Through the [facility] 2.5 catheter, a Renegade STC catheter over a fathom guidewire was advanced into the main SMA trunk. Catheter was advanced into the ileocolic branch. Ileocolic angiogram performed. Ileocolic: Ileocolic and right colic branches are patent. There are hypervascular branches from the right iliac colic and marginal artery territory. Active contrast extravasation has temporarily stopped. Microcatheter was advanced into the right colic artery. Branch of the right colic artery was catheterized. Tortuous hypervascularity noted within this vascular territory which correlates with the area of active bleeding by prior angiograms and CTA. For embolization, 2 2 mm microcoils were deployed within the peripheral right colic branch. Following this, follow-up peripheral right colic angiogram through the microcatheter demonstrates no further active bleeding or hypervascularity. This was repeated after approximately 5 minutes and there was no further active arterial bleeding in the right colon or hypervascularity demonstrated. Final repeat SMA angiogram again demonstrates no further active  bleeding in the right colic vascular territory or any residual hypervascularity. Access removed. Assist and excessive device. No immediate complication. Patient tolerated the procedure well. IMPRESSION: Positive angiogram for active arterial bleeding from a small peripheral branch of the right colic artery into the right colon. This correlates with the CTA. Successful peripheral right colic artery micro coil embolization at the bleeding site as above. Electronically Signed   By: Judie Petit.  Shick M.D.   On: 11/14/2019 08:53   IR Angiogram Selective Each Additional Vessel  Result Date: 11/14/2019 INDICATION: Acute lower GI bleeding, positive CTA for right colon active arterial bleeding. EXAM: Ultrasound guidance for vascular access SMA catheterization and angiograms Ileocolic and right colic peripheral micro catheterization and angiograms Peripheral right colic micro catheterization, angiogram, and micro coil embolization for active bleeding. MEDICATIONS: 1% lidocaine local. ANESTHESIA/SEDATION: Moderate (conscious) sedation was employed during this procedure. A total of Versed 2.0 mg and Fentanyl 50 mcg was administered intravenously. Moderate Sedation Time: 74 minutes. The patient's level of consciousness and vital signs were monitored continuously by radiology nursing throughout the procedure under my direct supervision. CONTRAST:  125 cc omni 300 FLUOROSCOPY TIME:  Fluoroscopy Time: 22 minutes 48 seconds (343 mGy). COMPLICATIONS: None immediate. PROCEDURE: Informed consent was obtained from the patient following explanation of the procedure, risks, benefits and alternatives. The patient understands, agrees and consents for the procedure. All questions were addressed. A time out was performed prior to the initiation of the procedure.  Maximal barrier sterile technique utilized including caps, mask, sterile gowns, sterile gloves, large sterile drape, hand hygiene, and Betadine prep. Under sterile conditions and local  anesthesia, ultrasound micropuncture access performed of the patent right common femoral artery. Images obtained for documentation of the patent right common femoral artery. Five French sheath inserted over a guidewire. Initially, a C2 catheter over a Bentson guidewire was utilized to select the SMA. SMA angiogram performed. SMA: SMA is widely patent including the jejunal, colic and ileocolic branches. Suspicious hypervascularity in the right colon along the peripheral aspect of the right colic artery and marginal arteries. Repeat SMA angiogram performed with a power injection confirming active contrast extravasation/colonic bleeding from a small peripheral right colic branch in the right colon. This correlates with the CTA. The C2 catheter exchange for a chung 2.5 catheter. This was utilized to resect the SMA origin for better purchase. Additional SMA angiogram confirms active bleeding in the right colon from a small peripheral branch from the right colic artery. Through the [facility] 2.5 catheter, a Renegade STC catheter over a fathom guidewire was advanced into the main SMA trunk. Catheter was advanced into the ileocolic branch. Ileocolic angiogram performed. Ileocolic: Ileocolic and right colic branches are patent. There are hypervascular branches from the right iliac colic and marginal artery territory. Active contrast extravasation has temporarily stopped. Microcatheter was advanced into the right colic artery. Branch of the right colic artery was catheterized. Tortuous hypervascularity noted within this vascular territory which correlates with the area of active bleeding by prior angiograms and CTA. For embolization, 2 2 mm microcoils were deployed within the peripheral right colic branch. Following this, follow-up peripheral right colic angiogram through the microcatheter demonstrates no further active bleeding or hypervascularity. This was repeated after approximately 5 minutes and there was no further active  arterial bleeding in the right colon or hypervascularity demonstrated. Final repeat SMA angiogram again demonstrates no further active bleeding in the right colic vascular territory or any residual hypervascularity. Access removed. Assist and excessive device. No immediate complication. Patient tolerated the procedure well. IMPRESSION: Positive angiogram for active arterial bleeding from a small peripheral branch of the right colic artery into the right colon. This correlates with the CTA. Successful peripheral right colic artery micro coil embolization at the bleeding site as above. Electronically Signed   By: Judie Petit.  Shick M.D.   On: 11/14/2019 08:53   IR US Guide Vasc Access Right  Result Date: 11/14/2019 INDICATION: Acute lower GI bleeding, positive CTA for right colon active arterial bleeding. EXAM: Ultrasound guidance for vascular access SMA catheterization and angiograms Ileocolic and right colic peripheral micro catheterization and angiograms Peripheral right colic micro catheterization, angiogram, and micro coil embolization for active bleeding. MEDICATIONS: 1% lidocaine local. ANESTHESIA/SEDATION: Moderate (conscious) sedation was employed during this procedure. A total of Versed 2.0 mg and Fentanyl 50 mcg was administered intravenously. Moderate Sedation Time: 74 minutes. The patient's level of consciousness and vital signs were monitored continuously by radiology nursing throughout the procedure under my direct supervision. CONTRAST:  125 cc omni 300 FLUOROSCOPY TIME:  Fluoroscopy Time: 22 minutes 48 seconds (343 mGy). COMPLICATIONS: None immediate. PROCEDURE: Informed consent was obtained from the patient following explanation of the procedure, risks, benefits and alternatives. The patient understands, agrees and consents for the procedure. All questions were addressed. A time out was performed prior to the initiation of the procedure. Maximal barrier sterile technique utilized including caps, mask,  sterile gowns, sterile gloves, large sterile drape, hand hygiene, and Betadine prep. Under  sterile conditions and local anesthesia, ultrasound micropuncture access performed of the patent right common femoral artery. Images obtained for documentation of the patent right common femoral artery. Five French sheath inserted over a guidewire. Initially, a C2 catheter over a Bentson guidewire was utilized to select the SMA. SMA angiogram performed. SMA: SMA is widely patent including the jejunal, colic and ileocolic branches. Suspicious hypervascularity in the right colon along the peripheral aspect of the right colic artery and marginal arteries. Repeat SMA angiogram performed with a power injection confirming active contrast extravasation/colonic bleeding from a small peripheral right colic branch in the right colon. This correlates with the CTA. The C2 catheter exchange for a chung 2.5 catheter. This was utilized to resect the SMA origin for better purchase. Additional SMA angiogram confirms active bleeding in the right colon from a small peripheral branch from the right colic artery. Through the [facility] 2.5 catheter, a Renegade STC catheter over a fathom guidewire was advanced into the main SMA trunk. Catheter was advanced into the ileocolic branch. Ileocolic angiogram performed. Ileocolic: Ileocolic and right colic branches are patent. There are hypervascular branches from the right iliac colic and marginal artery territory. Active contrast extravasation has temporarily stopped. Microcatheter was advanced into the right colic artery. Branch of the right colic artery was catheterized. Tortuous hypervascularity noted within this vascular territory which correlates with the area of active bleeding by prior angiograms and CTA. For embolization, 2 2 mm microcoils were deployed within the peripheral right colic branch. Following this, follow-up peripheral right colic angiogram through the microcatheter demonstrates no  further active bleeding or hypervascularity. This was repeated after approximately 5 minutes and there was no further active arterial bleeding in the right colon or hypervascularity demonstrated. Final repeat SMA angiogram again demonstrates no further active bleeding in the right colic vascular territory or any residual hypervascularity. Access removed. Assist and excessive device. No immediate complication. Patient tolerated the procedure well. IMPRESSION: Positive angiogram for active arterial bleeding from a small peripheral branch of the right colic artery into the right colon. This correlates with the CTA. Successful peripheral right colic artery micro coil embolization at the bleeding site as above. Electronically Signed   By: Judie Petit.  Shick M.D.   On: 11/14/2019 08:53   IR EMBO ART  VEN HEMORR LYMPH EXTRAV  INC GUIDE ROADMAPPING  Result Date: 11/14/2019 INDICATION: Acute lower GI bleeding, positive CTA for right colon active arterial bleeding. EXAM: Ultrasound guidance for vascular access SMA catheterization and angiograms Ileocolic and right colic peripheral micro catheterization and angiograms Peripheral right colic micro catheterization, angiogram, and micro coil embolization for active bleeding. MEDICATIONS: 1% lidocaine local. ANESTHESIA/SEDATION: Moderate (conscious) sedation was employed during this procedure. A total of Versed 2.0 mg and Fentanyl 50 mcg was administered intravenously. Moderate Sedation Time: 74 minutes. The patient's level of consciousness and vital signs were monitored continuously by radiology nursing throughout the procedure under my direct supervision. CONTRAST:  125 cc omni 300 FLUOROSCOPY TIME:  Fluoroscopy Time: 22 minutes 48 seconds (343 mGy). COMPLICATIONS: None immediate. PROCEDURE: Informed consent was obtained from the patient following explanation of the procedure, risks, benefits and alternatives. The patient understands, agrees and consents for the procedure. All  questions were addressed. A time out was performed prior to the initiation of the procedure. Maximal barrier sterile technique utilized including caps, mask, sterile gowns, sterile gloves, large sterile drape, hand hygiene, and Betadine prep. Under sterile conditions and local anesthesia, ultrasound micropuncture access performed of the patent right common femoral artery.  Images obtained for documentation of the patent right common femoral artery. Five French sheath inserted over a guidewire. Initially, a C2 catheter over a Bentson guidewire was utilized to select the SMA. SMA angiogram performed. SMA: SMA is widely patent including the jejunal, colic and ileocolic branches. Suspicious hypervascularity in the right colon along the peripheral aspect of the right colic artery and marginal arteries. Repeat SMA angiogram performed with a power injection confirming active contrast extravasation/colonic bleeding from a small peripheral right colic branch in the right colon. This correlates with the CTA. The C2 catheter exchange for a chung 2.5 catheter. This was utilized to resect the SMA origin for better purchase. Additional SMA angiogram confirms active bleeding in the right colon from a small peripheral branch from the right colic artery. Through the [facility] 2.5 catheter, a Renegade STC catheter over a fathom guidewire was advanced into the main SMA trunk. Catheter was advanced into the ileocolic branch. Ileocolic angiogram performed. Ileocolic: Ileocolic and right colic branches are patent. There are hypervascular branches from the right iliac colic and marginal artery territory. Active contrast extravasation has temporarily stopped. Microcatheter was advanced into the right colic artery. Branch of the right colic artery was catheterized. Tortuous hypervascularity noted within this vascular territory which correlates with the area of active bleeding by prior angiograms and CTA. For embolization, 2 2 mm microcoils were  deployed within the peripheral right colic branch. Following this, follow-up peripheral right colic angiogram through the microcatheter demonstrates no further active bleeding or hypervascularity. This was repeated after approximately 5 minutes and there was no further active arterial bleeding in the right colon or hypervascularity demonstrated. Final repeat SMA angiogram again demonstrates no further active bleeding in the right colic vascular territory or any residual hypervascularity. Access removed. Assist and excessive device. No immediate complication. Patient tolerated the procedure well. IMPRESSION: Positive angiogram for active arterial bleeding from a small peripheral branch of the right colic artery into the right colon. This correlates with the CTA. Successful peripheral right colic artery micro coil embolization at the bleeding site as above. Electronically Signed   By: Jerilynn Mages.  Shick M.D.   On: 11/14/2019 08:53   CT Angio Abd/Pel W and/or Wo Contrast  Result Date: 11/13/2019 CLINICAL DATA:  Significant bloody stools this morning. Evaluate for source of GI bleeding. EXAM: CTA ABDOMEN AND PELVIS WITHOUT AND WITH CONTRAST TECHNIQUE: Multidetector CT imaging of the abdomen and pelvis was performed using the standard protocol during bolus administration of intravenous contrast. Multiplanar reconstructed images and MIPs were obtained and reviewed to evaluate the vascular anatomy. CONTRAST:  179mL OMNIPAQUE IOHEXOL 350 MG/ML SOLN COMPARISON:  None. FINDINGS: VASCULAR Aorta: Normal caliber of the abdominal aorta. No evidence of abdominal aortic dissection or periaortic stranding. Celiac: Widely patent without a hemodynamically significant narrowing. Mild ectasia of the splenic artery at the level of the hilum measuring approximately 0.8 cm in diameter (coronal image 69, series 5), of doubtful clinical concern. SMA: Widely patent without a hemodynamically significant narrowing. The distal tributaries of the SMA  appear widely patent without discrete intraluminal filling defect to suggest distal embolism. Pooling intraluminal contrast extravasation is seen within the mid ascending colon via a tertiary branch/arcade of the right colic artery. Renals: Solitary bilaterally; widely patent without hemodynamically significant narrowing. No vessel irregularity to suggest FMD. IMA: Widely patent without a hemodynamically significant narrowing. Inflow: The bilateral common, external and internal iliac arteries are of normal caliber and widely patent without hemodynamically significant narrowing. Proximal Outflow: The bilateral common and  imaged portions of the bilateral deep and superficial femoral arteries are widely patent without hemodynamically significant narrowing. Veins: The IVC and pelvic venous systems appear widely patent. Note is made of a retroaortic left renal vein. Review of the MIP images confirms the above findings. _________________________________________________________ NON-VASCULAR Lower chest: Limited visualization of the lower thorax demonstrates minimal dependent subpleural ground-glass atelectasis. No discrete focal airspace opacities. No pleural effusion Normal heart size.  No pericardial effusion. Hepatobiliary: Normal hepatic contour. 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 Normal appearance of the gallbladder. No radiopaque gallstones. No intra or extrahepatic biliary ductal dilatation. No ascites. Pancreas: Normal appearance of the pancreas. Spleen: Normal appearance of the spleen. Note is made of a small splenule about the anterior tip of the spleen. Adrenals/Urinary Tract: Review of the precontrast images demonstrate excretion of contrast within the bilateral renal collecting systems. No definite renal stones. No discrete renal lesions.  No urine obstruction or perinephric stranding. Normal appearance the bilateral adrenal glands. Normal appearance of the urinary bladder given degree of distention. Stomach/Bowel: There is pooling of intraluminal contrast within the mid aspect of the ascending colon (axial image 67, series 6; image 41, series 12; coronal image 44, series 6), compatible with an area of active lower GI bleeding. Rather extensive colonic diverticulosis is seen throughout the colon, including at the location of the active GI bleeding. No discrete colonic mass or evidence of enteric obstruction. Normal appearance of the terminal ileum and the retrocecal appendix. No pneumoperitoneum, pneumatosis or portal venous gas. Lymphatic: While there is no bulky retroperitoneal, mesenteric, pelvic or inguinal lymphadenopathy, note is made of an approximately 2.3 x 1.9 x 1.9 cm well-defined nonenhancing nodule within the midline of the upper abdominal mesentery (image 52, series 6; image 31, series 12; coronal image 28, series 8). Reproductive: Mildly enlarged but otherwise normal-appearing uterus. No discrete adnexal lesion. No free fluid in the pelvic cul-de-sac. Other: Regional soft tissues appear normal. Musculoskeletal: No acute or aggressive osseous abnormalities. Mild-to-moderate multilevel lumbar spine DDD, worse at L4-L5 and to a lesser extent, L2-L3 with disc space height loss, endplate irregularity and sclerosis. Post right total hip replacement with age-indeterminate ill-defined lucency surrounding the acetabular component. 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 IMPRESSION: 1. 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 2. Normal caliber of the abdominal aorta without significant atherosclerotic plaque. 3.  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 Comparison with prior outside examinations (if available), is advised. Otherwise, follow-up multiphase CT scan in 3 months could be performed for further evaluation as indicated. 4. Post right total hip replacement with age-indeterminate ill-defined lucency surrounding the acetabular component, nonspecific though could be seen in the setting hardware loosening. Clinical correlation is advised. 5. Mild to moderate multilevel lumbar spine DDD. 6. 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 Critical Value/emergent results were called by telephone at the time of interpretation on 11/13/2019 at 4:17 pm to provider Dr. Myrtie Neitheranis, who verbally acknowledged these results. Electronically Signed   By: Simonne ComeJohn  Watts M.D.   On: 11/13/2019 16:42     Gilma Bessette M.D  on 11/14/2019 at 3:05 PM  Between 7am to 7pm - Pager - [phone]  After 7pm go to [email or web address] - password [facility]  [facility] -  Office  [phone]

## 2019-11-14 NOTE — Progress Notes (Signed)
[facility] Progress Note  Chief Complaint: Hematochezia  History:  Lindsey Stone had a difficult day yesterday and did not sleep well last night, but is starting to feel better today.  No passage of blood per rectum since late last night, and even then it was a smaller amount than earlier in the day. CT angiogram resulted late yesterday afternoon, and I received a call from the interventional radiologist.  It showed active bleeding in the right colon, suspected to be diverticular.  Abdominal angiogram performed last evening, extravasation controlled by placement of 2 coils in a feeder vessel.   ROS: Cardiovascular: No chest pain Respiratory: No dyspnea Urinary: No dysuria Generalized fatigue  Appetite returning, she was having breakfast when I saw her  Objective:   Current Facility-Administered Medications:  .  0.9 %  sodium chloride infusion, 10 mL/hr, Intravenous, Once, Smith, Rondell A, MD .  0.9 %  sodium chloride infusion, , Intravenous, Continuous, Smith, Rondell A, MD, Last Rate: 100 mL/hr at 11/13/19 2209, New Bag at 11/13/19 2209 .  acetaminophen (TYLENOL) tablet 650 mg, 650 mg, Oral, Q6H PRN **OR** acetaminophen (TYLENOL) suppository 650 mg, 650 mg, Rectal, Q6H PRN, Smith, Rondell A, MD .  albuterol (PROVENTIL) (2.5 MG/3ML) 0.083% nebulizer solution 2.5 mg, 2.5 mg, Nebulization, Q6H PRN, Clydie Braun, MD .  fentaNYL (SUBLIMAZE) injection, , Intravenous, PRN, Berdine Dance, MD, 25 mcg at 11/13/19 1823 .  lidocaine (XYLOCAINE) 1 % (with pres) injection, , , PRN, Berdine Dance, MD, 10 mL at 11/13/19 1757 .  midazolam (VERSED) injection, , Intravenous, PRN, Berdine Dance, MD, 1 mg at 11/13/19 1823 .  ondansetron (ZOFRAN) tablet 4 mg, 4 mg, Oral, Q6H PRN **OR** ondansetron (ZOFRAN) injection 4 mg, 4 mg, Intravenous, Q6H PRN, Smith, Rondell A, MD .  sodium chloride flush (NS) 0.9 % injection 3 mL, 3 mL, Intravenous, Q12H, Smith, Rondell A, MD, 3 mL at 11/13/19 2210  . sodium  chloride    . sodium chloride 100 mL/hr at 11/13/19 2209     Vital signs in last 24 hrs: Vitals:   11/14/19 0525 11/14/19 0743  BP: (!) 111/50 (!) 95/50  Pulse: 95 83  Resp: 16 18  Temp: 97.9 F (36.6 C) 97.6 F (36.4 C)  SpO2: 98% 98%    Intake/Output Summary (Last 24 hours) at 11/14/2019 0854 Last data filed at 11/14/2019 0845 Gross per 24 hour  Intake 3262.32 ml  Output 1050 ml  Net 2212.32 ml     Physical Exam Pale, fatigued, otherwise well-appearing  HEENT: sclera anicteric, oral mucosa without lesions  Neck: supple, no thyromegaly, JVD or lymphadenopathy  Cardiac: RRR without murmurs, S1S2 heard, no peripheral edema  Pulm: clear to auscultation bilaterally, normal RR and effort noted  Abdomen: soft, no tenderness, with active bowel sounds. No guarding or palpable hepatosplenomegaly  Skin; warm and dry, no jaundice Right groin catheter was removed last evening, dressing clean dry intact, no tenderness, surrounding erythema or hematoma. Recent Labs:  CBC Latest Ref Rng & Units 11/14/2019 11/13/2019 11/13/2019  WBC 4.0 - 10.5 K/uL 8.3 - -  Hemoglobin 12.0 - 15.0 g/dL 6.6(LL) 7.7(L) 8.4(L)  Hematocrit 36.0 - 46.0 % 20.6(L) 23.8(L) 26.7(L)  Platelets 150 - 400 K/uL 158 - -    Recent Labs  Lab 11/13/19 1015  INR 1.1   CMP Latest Ref Rng & Units 11/14/2019 11/13/2019 07/31/2010  Glucose 70 - 99 mg/dL 017(C) 944(H) 86  BUN 8 - 23 mg/dL 7(L) 13 14  Creatinine 0.44 - 1.00  mg/dL 1.60 7.37 0.7  Sodium 106 - 145 mmol/L 140 139 140  Potassium 3.5 - 5.1 mmol/L 3.4(L) 3.5 3.2(L)  Chloride 98 - 111 mmol/L 109 107 102  CO2 22 - 32 mmol/L 26 23 -  Calcium 8.9 - 10.3 mg/dL 7.2(L) 8.1(L) -  Total Protein 6.5 - 8.1 g/dL - 5.4(L) -  Total Bilirubin 0.3 - 1.2 mg/dL - 0.6 -  Alkaline Phos 38 - 126 U/L - 74 -  AST 15 - 41 U/L - 16 -  ALT 0 - 44 U/L - 13 -     Radiologic studies: CLINICAL DATA:  Significant bloody stools this morning. Evaluate for source of GI bleeding.    EXAM: CTA ABDOMEN AND PELVIS WITHOUT AND WITH CONTRAST   TECHNIQUE: Multidetector CT imaging of the abdomen and pelvis was performed using the standard protocol during bolus administration of intravenous contrast. Multiplanar reconstructed images and MIPs were obtained and reviewed to evaluate the vascular anatomy.   CONTRAST:  OMNIPAQUE IOHEXOL 350 MG/ML SOLN   COMPARISON:  None.   FINDINGS: VASCULAR   Aorta: Normal caliber of the abdominal aorta. No evidence of abdominal aortic dissection or periaortic stranding.   Celiac: Widely patent without a hemodynamically significant narrowing. Mild ectasia of the splenic artery at the level of the hilum measuring approximately 0.8 cm in diameter (coronal image 69, series 5), of doubtful clinical concern.   SMA: Widely patent without a hemodynamically significant narrowing. The distal tributaries of the SMA appear widely patent without discrete intraluminal filling defect to suggest distal embolism.   Pooling intraluminal contrast extravasation is seen within the mid ascending colon via a tertiary branch/arcade of the right colic artery.   Renals: Solitary bilaterally; widely patent without hemodynamically significant narrowing. No vessel irregularity to suggest FMD.   IMA: Widely patent without a hemodynamically significant narrowing.   Inflow: The bilateral common, external and internal iliac arteries are of normal caliber and widely patent without hemodynamically significant narrowing.   Proximal Outflow: The bilateral common and imaged portions of the bilateral deep and superficial femoral arteries are widely patent without hemodynamically significant narrowing.   Veins: The IVC and pelvic venous systems appear widely patent. Note is made of a retroaortic left renal vein.   Review of the MIP images confirms the above findings.   _________________________________________________________   NON-VASCULAR   Lower  chest: Limited visualization of the lower thorax demonstrates minimal dependent subpleural ground-glass atelectasis. No discrete focal airspace opacities. No pleural effusion   Normal heart size.  No pericardial effusion.   Hepatobiliary: Normal hepatic contour. 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 Normal appearance of the gallbladder. No radiopaque gallstones. No intra or extrahepatic biliary ductal dilatation. No ascites.   Pancreas: Normal appearance of the pancreas.   Spleen: Normal appearance of the spleen. Note is made of a small splenule about the anterior tip of the spleen.   Adrenals/Urinary Tract: Review of the precontrast images demonstrate excretion of contrast within the bilateral renal collecting systems. No definite renal stones. No discrete renal lesions. No urine obstruction or perinephric stranding. Normal appearance the bilateral adrenal glands. Normal appearance of the urinary bladder given degree of distention.   Stomach/Bowel: There is pooling of intraluminal contrast within the mid aspect of the ascending colon (axial image 67, series 6; image 41, series 12; coronal image 44,  series 6), compatible with an area of active lower GI bleeding. Rather extensive colonic diverticulosis is seen throughout the colon, including at the location of the active GI bleeding. No discrete colonic mass or evidence of enteric obstruction. Normal appearance of the terminal ileum and the retrocecal appendix. No pneumoperitoneum, pneumatosis or portal venous gas.   Lymphatic: While there is no bulky retroperitoneal, mesenteric, pelvic or inguinal lymphadenopathy, note is made of an approximately 2.3 x 1.9 x 1.9 cm well-defined nonenhancing nodule within the midline of the upper abdominal mesentery (image 52,  series 6; image 31, series 12; coronal image 28, series 8).   Reproductive: Mildly enlarged but otherwise normal-appearing uterus. No discrete adnexal lesion. No free fluid in the pelvic cul-de-sac.   Other: Regional soft tissues appear normal.   Musculoskeletal: No acute or aggressive osseous abnormalities. Mild-to-moderate multilevel lumbar spine DDD, worse at L4-L5 and to a lesser extent, L2-L3 with disc space height loss, endplate irregularity and sclerosis.   Post right total hip replacement with age-indeterminate ill-defined lucency surrounding the acetabular component. 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   IMPRESSION: 1. 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 2. Normal caliber of the abdominal aorta without significant atherosclerotic plaque. 3. 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 Comparison with prior outside examinations (if available), is advised. Otherwise, follow-up multiphase CT scan in 3 months could be performed for further evaluation as indicated. 4. Post right total hip replacement with age-indeterminate ill-defined lucency surrounding the acetabular component, nonspecific though could be seen in the setting hardware loosening. Clinical correlation is advised. 5. Mild to moderate multilevel lumbar spine DDD. 6. 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   Critical Value/emergent results were called by telephone at the time of interpretation on 11/13/2019 at 4:17 pm to provider Dr. Myrtie Neither, who verbally acknowledged these results.     Electronically Signed   By: Simonne Come M.D.   On: 11/13/2019 16:42 _______________________________________________  INDICATION: Acute lower GI bleeding, positive CTA for right colon active arterial bleeding.   EXAM: Ultrasound guidance for vascular access   SMA catheterization and angiograms   Ileocolic and right colic peripheral micro catheterization and angiograms   Peripheral right colic micro catheterization, angiogram, and micro coil embolization for active bleeding.   MEDICATIONS: 1% lidocaine local.   ANESTHESIA/SEDATION: Moderate (conscious) sedation was employed during this procedure. A total of Versed 2.0 mg and Fentanyl 50 mcg was administered intravenously.   Moderate Sedation Time: 74 minutes. The patient's level of consciousness and vital signs were monitored continuously by radiology nursing throughout the procedure under my direct supervision.   CONTRAST:  125 cc omni 300   FLUOROSCOPY TIME:  Fluoroscopy Time: 22 minutes 48 seconds (343 mGy).   COMPLICATIONS: None immediate.   PROCEDURE: Informed consent was obtained from the patient following explanation of the procedure, risks, benefits and alternatives. The patient understands, agrees and consents for the procedure. All questions were addressed. A time out was performed prior to the initiation of the procedure. Maximal barrier sterile technique utilized including caps, mask, sterile gowns, sterile gloves, large sterile drape, hand hygiene, and Betadine prep.   Under sterile conditions and local anesthesia, ultrasound micropuncture access performed of the patent right  common femoral artery. Images obtained for documentation of the patent right common femoral artery. Five French sheath inserted over a guidewire. Initially, a C2 catheter over a Bentson guidewire was utilized to select the SMA. SMA angiogram performed.   SMA: SMA is widely patent including the jejunal, colic and ileocolic branches. Suspicious hypervascularity  in the right colon along the peripheral aspect of the right colic artery and marginal arteries.   Repeat SMA angiogram performed with a power injection confirming active contrast extravasation/colonic bleeding from a small peripheral right colic branch in the right colon. This correlates with the CTA.   The C2 catheter exchange for a chung 2.5 catheter. This was utilized to resect the SMA origin for better purchase. Additional SMA angiogram confirms active bleeding in the right colon from a small peripheral branch from the right colic artery.   Through the [facility] 2.5 catheter, a Renegade STC catheter over a fathom guidewire was advanced into the main SMA trunk. Catheter was advanced into the ileocolic branch. Ileocolic angiogram performed.   Ileocolic: Ileocolic and right colic branches are patent. There are hypervascular branches from the right iliac colic and marginal artery territory. Active contrast extravasation has temporarily stopped.   Microcatheter was advanced into the right colic artery. Branch of the right colic artery was catheterized. Tortuous hypervascularity noted within this vascular territory which correlates with the area of active bleeding by prior angiograms and CTA.   For embolization, 2 2 mm microcoils were deployed within the peripheral right colic branch. Following this, follow-up peripheral right colic angiogram through the microcatheter demonstrates no further active bleeding or hypervascularity. This was repeated after approximately 5 minutes and there was no further active arterial bleeding in the right colon or hypervascularity demonstrated.   Final repeat SMA angiogram again demonstrates no further active bleeding in the right colic vascular territory or any residual hypervascularity.   Access removed. Assist and excessive device. No immediate complication. Patient tolerated the procedure well.   IMPRESSION: Positive angiogram for active  arterial bleeding from a small peripheral branch of the right colic artery into the right colon. This correlates with the CTA. Successful peripheral right colic artery micro coil embolization at the bleeding site as above.     Electronically Signed   By: Jerilynn Mages.  Shick M.D.   On: 11/14/2019 08:53     Assessment & Plan  Assessment: Hematochezia from acute right-sided colonic diverticular bleeding Acute blood loss anemia  Bleeding controlled with interventional radiology procedure yesterday.  Thus far no recurrent bleeding.  Profound acute blood loss anemia, additional unit of PRBCs transfused overnight.  Plan: Regular diet today In hospital observation until tomorrow to ensure no recurrent bleeding.  If none, patient can be discharged home for as needed outpatient follow-up with Dr. Richmond Campbell, her primary GI physician. CBC this afternoon and tomorrow morning  Signing off, call as needed. Nelida Meuse III Office: [phone]

## 2019-11-15 DIAGNOSIS — I10 Essential (primary) hypertension: Secondary | ICD-10-CM

## 2019-11-15 LAB — BASIC METABOLIC PANEL
Anion gap: 8 (ref 5–15)
BUN: 12 mg/dL (ref 8–23)
CO2: 22 mmol/L (ref 22–32)
Calcium: 7.5 mg/dL — ABNORMAL LOW (ref 8.9–10.3)
Chloride: 112 mmol/L — ABNORMAL HIGH (ref 98–111)
Creatinine, Ser: 0.58 mg/dL (ref 0.44–1.00)
GFR calc Af Amer: >60 mL/min (ref >60)
GFR calc non Af Amer: >60 mL/min (ref >60)
Glucose, Bld: 91 mg/dL (ref 70–99)
Potassium: 3.3 mmol/L — ABNORMAL LOW (ref 3.5–5.1)
Sodium: 142 mmol/L (ref 135–145)

## 2019-11-15 LAB — CBC
HCT: 21.9 % — ABNORMAL LOW (ref 36.0–46.0)
Hemoglobin: 7.1 g/dL — ABNORMAL LOW (ref 12.0–15.0)
MCH: 29.8 pg (ref 26.0–34.0)
MCHC: 32.4 g/dL (ref 30.0–36.0)
MCV: 92 fL (ref 80.0–100.0)
Platelets: 139 10*3/uL — ABNORMAL LOW (ref 150–400)
RBC: 2.38 MIL/uL — ABNORMAL LOW (ref 3.87–5.11)
RDW: 14.1 % (ref 11.5–15.5)
WBC: 8.1 10*3/uL (ref 4.0–10.5)
nRBC: 0 % (ref 0.0–0.2)

## 2019-11-15 LAB — HEMOGLOBIN AND HEMATOCRIT, BLOOD
HCT: 22.3 % — ABNORMAL LOW (ref 36.0–46.0)
HCT: 25.9 % — ABNORMAL LOW (ref 36.0–46.0)
Hemoglobin: 7.1 g/dL — ABNORMAL LOW (ref 12.0–15.0)
Hemoglobin: 8.6 g/dL — ABNORMAL LOW (ref 12.0–15.0)

## 2019-11-15 LAB — PREPARE RBC (CROSSMATCH)

## 2019-11-15 MED ORDER — ACETAMINOPHEN 325 MG PO TABS
650.0000 mg | ORAL_TABLET | Freq: Once | ORAL | Status: AC
  Administered 2019-11-15: 16:00:00 650 mg via ORAL
  Filled 2019-11-15: qty 2

## 2019-11-15 MED ORDER — POTASSIUM CHLORIDE CRYS ER 20 MEQ PO TBCR
40.0000 meq | EXTENDED_RELEASE_TABLET | Freq: Once | ORAL | Status: AC
  Administered 2019-11-15: 40 meq via ORAL
  Filled 2019-11-15: qty 2

## 2019-11-15 MED ORDER — DIPHENHYDRAMINE HCL 50 MG/ML IJ SOLN
25.0000 mg | Freq: Once | INTRAMUSCULAR | Status: AC
  Administered 2019-11-15: 25 mg via INTRAVENOUS
  Filled 2019-11-15: qty 1

## 2019-11-15 MED ORDER — FUROSEMIDE 10 MG/ML IJ SOLN
20.0000 mg | Freq: Once | INTRAMUSCULAR | Status: AC
  Administered 2019-11-15: 19:00:00 20 mg via INTRAVENOUS
  Filled 2019-11-15: qty 2

## 2019-11-15 MED ORDER — SODIUM CHLORIDE 0.9% IV SOLUTION: Freq: Once | INTRAVENOUS | Status: AC

## 2019-11-15 NOTE — Progress Notes (Signed)
1 unit prbc started at 1549 per MD orders. RN at bedside for first 15 minutes of transfusion with no s/s of transfusion reaction noted. VSS. Will continue to monitor.

## 2019-11-15 NOTE — Progress Notes (Signed)
PROGRESS NOTE        PATIENT DETAILS Name: Lindsey Stone Age: 64 y.o. Sex: female Date of Birth: 03-24-1956 Admit Date: 11/13/2019 Admitting Physician Clydie Braun, MD ZCH:[id number], Misty Stanley, MD  Brief Narrative: Patient is a 64 y.o. female with history of diverticulosis-who presented with lower GI bleeding secondary to diverticulosis.  Significant events: 5/10>> admit to [facility] for lower GI bleeding 5/10>> positive angiogram for active arterial bleeding from small peripheral branch of the right colic artery-successful peripheral right colic artery microembolization  Antimicrobial therapy: None  Microbiology data: None  Procedures : 5/10>> embolization by IR  Consults: IR, GI  DVT Prophylaxis : SCD's  Subjective: No further hematochezia-but has not had a bowel movement yet.  Feels weak-looks very pale.  Assessment/Plan: Lower GI bleeding due to diverticulosis with acute blood loss anemia: No further bleeding since embolization-but hemoglobin 7.1. looks very pale-feels weak-hence we will go ahead and transfuse 1 additional unit of PRBC.  Has received 2 units of PRBC transfusion so far.  Hypokalemia: Replete and recheck  HTN: Blood pressure soft-continue to hold HCTZ.   Diet: Diet Order            Diet regular Room service appropriate? Yes; Fluid consistency: Thin  Diet effective now               Code Status: Full code   Family Communication: Patient will update family  Disposition Plan: Status is: Inpatient  Remains inpatient appropriate because:Inpatient level of care appropriate due to severity of illness   Dispo: The patient is from: Home              Anticipated d/c is to: Home              Anticipated d/c date is: 1 day              Patient currently is not medically stable to d/c.  Barriers to Discharge: Severe blood loss anemia-requires 1 additional unit of PRBC transfusion before consideration of  discharge.  Antimicrobial agents: Anti-infectives (From admission, onward)   None       Time spent: 25 minutes-Greater than 50% of this time was spent in counseling, explanation of diagnosis, planning of further management, and coordination of care.  MEDICATIONS: Scheduled Meds: . sodium chloride   Intravenous Once  . acetaminophen  650 mg Oral Once  . diphenhydrAMINE  25 mg Intravenous Once  . furosemide  20 mg Intravenous Once  . sodium chloride flush  3 mL Intravenous Q12H   Continuous Infusions: . sodium chloride     PRN Meds:.acetaminophen **OR** acetaminophen, albuterol, fentaNYL, lidocaine, midazolam, ondansetron **OR** ondansetron (ZOFRAN) IV   PHYSICAL EXAM: Vital signs: Vitals:   11/15/19 0351 11/15/19 0758 11/15/19 0800 11/15/19 1225  BP: (!) 108/54 (!) 113/54 (!) 121/50 (!) 113/58  Pulse: 83 85 96 81  Resp: 16 16  17   Temp: 98.3 F (36.8 C) 98.5 F (36.9 C)  98.5 F (36.9 C)  TempSrc: Oral Oral  Oral  SpO2: 94% 97% 97% 100%  Weight: 63.3 kg     Height:       Filed Weights   11/13/19 1014 11/13/19 2131 11/15/19 0351  Weight: 62.6 kg 63.1 kg 63.3 kg   Body mass index is 24.72 kg/m.   Gen Exam:Alert awake-not in any distress HEENT:atraumatic, normocephalic Chest: B/L clear  to auscultation anteriorly CVS:S1S2 regular Abdomen:soft non tender, non distended Extremities:no edema Neurology: Non focal Skin: no rash  I have personally reviewed following labs and imaging studies  LABORATORY DATA: CBC: Recent Labs  Lab 11/13/19 1015 11/13/19 1700 11/14/19 0336 11/14/19 1146 11/14/19 1812 11/15/19 0355 11/15/19 1208  WBC 7.9  --  8.3 7.6  --  8.1  --   NEUTROABS 6.0  --   --   --   --   --   --   HGB 9.5*   < > 6.6* 7.6* 7.5* 7.1* 7.1*  HCT 29.9*   < > 20.6* 23.3* 22.7* 21.9* 22.3*  MCV 93.7  --  92.8 91.4  --  92.0  --   PLT 185  --  158 142*  --  139*  --    < > = values in this interval not displayed.    Basic Metabolic Panel: Recent  Labs  Lab 11/13/19 1015 11/14/19 0336 11/15/19 0355  NA 139 140 142  K 3.5 3.4* 3.3*  CL 107 109 112*  CO2 23 26 22   GLUCOSE 105* 117* 91  BUN 13 7* 12  CREATININE 0.72 0.60 0.58  CALCIUM 8.1* 7.2* 7.5*    GFR: Estimated Creatinine Clearance: 65.4 mL/min (by C-G formula based on SCr of 0.58 mg/dL).  Liver Function Tests: Recent Labs  Lab 11/13/19 1015  AST 16  ALT 13  ALKPHOS 74  BILITOT 0.6  PROT 5.4*  ALBUMIN 2.6*   No results for input(s): LIPASE, AMYLASE in the last 168 hours. No results for input(s): AMMONIA in the last 168 hours.  Coagulation Profile: Recent Labs  Lab 11/13/19 1015  INR 1.1    Cardiac Enzymes: No results for input(s): CKTOTAL, CKMB, CKMBINDEX, TROPONINI in the last 168 hours.  BNP (last 3 results) No results for input(s): PROBNP in the last 8760 hours.  Lipid Profile: No results for input(s): CHOL, HDL, LDLCALC, TRIG, CHOLHDL, LDLDIRECT in the last 72 hours.  Thyroid Function Tests: No results for input(s): TSH, T4TOTAL, FREET4, T3FREE, THYROIDAB in the last 72 hours.  Anemia Panel: No results for input(s): VITAMINB12, FOLATE, FERRITIN, TIBC, IRON, RETICCTPCT in the last 72 hours.  Urine analysis: No results found for: COLORURINE, APPEARANCEUR, LABSPEC, PHURINE, GLUCOSEU, HGBUR, BILIRUBINUR, KETONESUR, PROTEINUR, UROBILINOGEN, NITRITE, LEUKOCYTESUR  Sepsis Labs: Lactic Acid, Venous No results found for: LATICACIDVEN  MICROBIOLOGY: Recent Results (from the past 240 hour(s))  SARS Coronavirus 2 by RT PCR (hospital order, [facility]) Nasopharyngeal Nasopharyngeal Swab     Status: None   Collection Time: 11/13/19 10:33 AM   Specimen: Nasopharyngeal Swab  Result Value Ref Range Status   SARS Coronavirus 2 NEGATIVE NEGATIVE Final    Comment: (NOTE) SARS-CoV-2 target nucleic acids are NOT DETECTED. The SARS-CoV-2 RNA is generally detectable in upper and lower respiratory specimens during the acute phase  of infection. The lowest concentration of SARS-CoV-2 viral copies this assay can detect is 250 copies / mL. A negative result does not preclude SARS-CoV-2 infection and should not be used as the sole basis for treatment or other patient management decisions.  A negative result may occur with improper specimen collection / handling, submission of specimen other than nasopharyngeal swab, presence of viral mutation(s) within the areas targeted by this assay, and inadequate number of viral copies (<250 copies / mL). A negative result must be combined with clinical observations, patient history, and epidemiological information. Fact Sheet for Patients:   BoilerBrush.com.cyhttps://www.fda.gov/media/136312/download Fact Sheet for  Healthcare Providers: https://pope.com/ This test is not yet approved or cleared  by the [place] and has been authorized for detection and/or diagnosis of SARS-CoV-2 by FDA under an Emergency Use Authorization (EUA).  This EUA will remain in effect (meaning this test can be used) for the duration of the COVID-19 declaration under Section 564(b)(1) of the Act, 21 U.S.C. section 360bbb-3(b)(1), unless the authorization is terminated or revoked sooner. Performed at [facility], [address] [place], [place] [address]     RADIOLOGY STUDIES/RESULTS: IR Angiogram Visceral Selective  Result Date: 11/14/2019 INDICATION: Acute lower GI bleeding, positive CTA for right colon active arterial bleeding. EXAM: Ultrasound guidance for vascular access SMA catheterization and angiograms Ileocolic and right colic peripheral micro catheterization and angiograms Peripheral right colic micro catheterization, angiogram, and micro coil embolization for active bleeding. MEDICATIONS: 1% lidocaine local. ANESTHESIA/SEDATION: Moderate (conscious) sedation was employed during this procedure. A total of Versed 2.0 mg and Fentanyl 50 mcg was administered intravenously.  Moderate Sedation Time: 74 minutes. The patient's level of consciousness and vital signs were monitored continuously by radiology nursing throughout the procedure under my direct supervision. CONTRAST:  125 cc omni 300 FLUOROSCOPY TIME:  Fluoroscopy Time: 22 minutes 48 seconds (343 mGy). COMPLICATIONS: None immediate. PROCEDURE: Informed consent was obtained from the patient following explanation of the procedure, risks, benefits and alternatives. The patient understands, agrees and consents for the procedure. All questions were addressed. A time out was performed prior to the initiation of the procedure. Maximal barrier sterile technique utilized including caps, mask, sterile gowns, sterile gloves, large sterile drape, hand hygiene, and Betadine prep. Under sterile conditions and local anesthesia, ultrasound micropuncture access performed of the patent right common femoral artery. Images obtained for documentation of the patent right common femoral artery. Five French sheath inserted over a guidewire. Initially, a C2 catheter over a Bentson guidewire was utilized to select the SMA. SMA angiogram performed. SMA: SMA is widely patent including the jejunal, colic and ileocolic branches. Suspicious hypervascularity in the right colon along the peripheral aspect of the right colic artery and marginal arteries. Repeat SMA angiogram performed with a power injection confirming active contrast extravasation/colonic bleeding from a small peripheral right colic branch in the right colon. This correlates with the CTA. The C2 catheter exchange for a chung 2.5 catheter. This was utilized to resect the SMA origin for better purchase. Additional SMA angiogram confirms active bleeding in the right colon from a small peripheral branch from the right colic artery. Through the [facility] 2.5 catheter, a Renegade STC catheter over a fathom guidewire was advanced into the main SMA trunk. Catheter was advanced into the ileocolic branch.  Ileocolic angiogram performed. Ileocolic: Ileocolic and right colic branches are patent. There are hypervascular branches from the right iliac colic and marginal artery territory. Active contrast extravasation has temporarily stopped. Microcatheter was advanced into the right colic artery. Branch of the right colic artery was catheterized. Tortuous hypervascularity noted within this vascular territory which correlates with the area of active bleeding by prior angiograms and CTA. For embolization, 2 2 mm microcoils were deployed within the peripheral right colic branch. Following this, follow-up peripheral right colic angiogram through the microcatheter demonstrates no further active bleeding or hypervascularity. This was repeated after approximately 5 minutes and there was no further active arterial bleeding in the right colon or hypervascularity demonstrated. Final repeat SMA angiogram again demonstrates no further active bleeding in the right colic vascular territory or any residual hypervascularity. Access removed.  Assist and excessive device. No immediate complication. Patient tolerated the procedure well. IMPRESSION: Positive angiogram for active arterial bleeding from a small peripheral branch of the right colic artery into the right colon. This correlates with the CTA. Successful peripheral right colic artery micro coil embolization at the bleeding site as above. Electronically Signed   By: Judie Petit.  Shick M.D.   On: 11/14/2019 08:53   IR Angiogram Selective Each Additional Vessel  Result Date: 11/14/2019 INDICATION: Acute lower GI bleeding, positive CTA for right colon active arterial bleeding. EXAM: Ultrasound guidance for vascular access SMA catheterization and angiograms Ileocolic and right colic peripheral micro catheterization and angiograms Peripheral right colic micro catheterization, angiogram, and micro coil embolization for active bleeding. MEDICATIONS: 1% lidocaine local. ANESTHESIA/SEDATION:  Moderate (conscious) sedation was employed during this procedure. A total of Versed 2.0 mg and Fentanyl 50 mcg was administered intravenously. Moderate Sedation Time: 74 minutes. The patient's level of consciousness and vital signs were monitored continuously by radiology nursing throughout the procedure under my direct supervision. CONTRAST:  125 cc omni 300 FLUOROSCOPY TIME:  Fluoroscopy Time: 22 minutes 48 seconds (343 mGy). COMPLICATIONS: None immediate. PROCEDURE: Informed consent was obtained from the patient following explanation of the procedure, risks, benefits and alternatives. The patient understands, agrees and consents for the procedure. All questions were addressed. A time out was performed prior to the initiation of the procedure. Maximal barrier sterile technique utilized including caps, mask, sterile gowns, sterile gloves, large sterile drape, hand hygiene, and Betadine prep. Under sterile conditions and local anesthesia, ultrasound micropuncture access performed of the patent right common femoral artery. Images obtained for documentation of the patent right common femoral artery. Five French sheath inserted over a guidewire. Initially, a C2 catheter over a Bentson guidewire was utilized to select the SMA. SMA angiogram performed. SMA: SMA is widely patent including the jejunal, colic and ileocolic branches. Suspicious hypervascularity in the right colon along the peripheral aspect of the right colic artery and marginal arteries. Repeat SMA angiogram performed with a power injection confirming active contrast extravasation/colonic bleeding from a small peripheral right colic branch in the right colon. This correlates with the CTA. The C2 catheter exchange for a chung 2.5 catheter. This was utilized to resect the SMA origin for better purchase. Additional SMA angiogram confirms active bleeding in the right colon from a small peripheral branch from the right colic artery. Through the [facility] 2.5  catheter, a Renegade STC catheter over a fathom guidewire was advanced into the main SMA trunk. Catheter was advanced into the ileocolic branch. Ileocolic angiogram performed. Ileocolic: Ileocolic and right colic branches are patent. There are hypervascular branches from the right iliac colic and marginal artery territory. Active contrast extravasation has temporarily stopped. Microcatheter was advanced into the right colic artery. Branch of the right colic artery was catheterized. Tortuous hypervascularity noted within this vascular territory which correlates with the area of active bleeding by prior angiograms and CTA. For embolization, 2 2 mm microcoils were deployed within the peripheral right colic branch. Following this, follow-up peripheral right colic angiogram through the microcatheter demonstrates no further active bleeding or hypervascularity. This was repeated after approximately 5 minutes and there was no further active arterial bleeding in the right colon or hypervascularity demonstrated. Final repeat SMA angiogram again demonstrates no further active bleeding in the right colic vascular territory or any residual hypervascularity. Access removed. Assist and excessive device. No immediate complication. Patient tolerated the procedure well. IMPRESSION: Positive angiogram for active arterial bleeding from a small  peripheral branch of the right colic artery into the right colon. This correlates with the CTA. Successful peripheral right colic artery micro coil embolization at the bleeding site as above. Electronically Signed   By: Jerilynn Mages.  Shick M.D.   On: 11/14/2019 08:53   IR US Guide Vasc Access Right  Result Date: 11/14/2019 INDICATION: Acute lower GI bleeding, positive CTA for right colon active arterial bleeding. EXAM: Ultrasound guidance for vascular access SMA catheterization and angiograms Ileocolic and right colic peripheral micro catheterization and angiograms Peripheral right colic micro  catheterization, angiogram, and micro coil embolization for active bleeding. MEDICATIONS: 1% lidocaine local. ANESTHESIA/SEDATION: Moderate (conscious) sedation was employed during this procedure. A total of Versed 2.0 mg and Fentanyl 50 mcg was administered intravenously. Moderate Sedation Time: 74 minutes. The patient's level of consciousness and vital signs were monitored continuously by radiology nursing throughout the procedure under my direct supervision. CONTRAST:  125 cc omni 300 FLUOROSCOPY TIME:  Fluoroscopy Time: 22 minutes 48 seconds (343 mGy). COMPLICATIONS: None immediate. PROCEDURE: Informed consent was obtained from the patient following explanation of the procedure, risks, benefits and alternatives. The patient understands, agrees and consents for the procedure. All questions were addressed. A time out was performed prior to the initiation of the procedure. Maximal barrier sterile technique utilized including caps, mask, sterile gowns, sterile gloves, large sterile drape, hand hygiene, and Betadine prep. Under sterile conditions and local anesthesia, ultrasound micropuncture access performed of the patent right common femoral artery. Images obtained for documentation of the patent right common femoral artery. Five French sheath inserted over a guidewire. Initially, a C2 catheter over a Bentson guidewire was utilized to select the SMA. SMA angiogram performed. SMA: SMA is widely patent including the jejunal, colic and ileocolic branches. Suspicious hypervascularity in the right colon along the peripheral aspect of the right colic artery and marginal arteries. Repeat SMA angiogram performed with a power injection confirming active contrast extravasation/colonic bleeding from a small peripheral right colic branch in the right colon. This correlates with the CTA. The C2 catheter exchange for a chung 2.5 catheter. This was utilized to resect the SMA origin for better purchase. Additional SMA angiogram  confirms active bleeding in the right colon from a small peripheral branch from the right colic artery. Through the [facility] 2.5 catheter, a Renegade STC catheter over a fathom guidewire was advanced into the main SMA trunk. Catheter was advanced into the ileocolic branch. Ileocolic angiogram performed. Ileocolic: Ileocolic and right colic branches are patent. There are hypervascular branches from the right iliac colic and marginal artery territory. Active contrast extravasation has temporarily stopped. Microcatheter was advanced into the right colic artery. Branch of the right colic artery was catheterized. Tortuous hypervascularity noted within this vascular territory which correlates with the area of active bleeding by prior angiograms and CTA. For embolization, 2 2 mm microcoils were deployed within the peripheral right colic branch. Following this, follow-up peripheral right colic angiogram through the microcatheter demonstrates no further active bleeding or hypervascularity. This was repeated after approximately 5 minutes and there was no further active arterial bleeding in the right colon or hypervascularity demonstrated. Final repeat SMA angiogram again demonstrates no further active bleeding in the right colic vascular territory or any residual hypervascularity. Access removed. Assist and excessive device. No immediate complication. Patient tolerated the procedure well. IMPRESSION: Positive angiogram for active arterial bleeding from a small peripheral branch of the right colic artery into the right colon. This correlates with the CTA. Successful peripheral right colic artery micro  coil embolization at the bleeding site as above. Electronically Signed   By: Judie Petit.  Shick M.D.   On: 11/14/2019 08:53   IR EMBO ART  VEN HEMORR LYMPH EXTRAV  INC GUIDE ROADMAPPING  Result Date: 11/14/2019 INDICATION: Acute lower GI bleeding, positive CTA for right colon active arterial bleeding. EXAM: Ultrasound guidance for  vascular access SMA catheterization and angiograms Ileocolic and right colic peripheral micro catheterization and angiograms Peripheral right colic micro catheterization, angiogram, and micro coil embolization for active bleeding. MEDICATIONS: 1% lidocaine local. ANESTHESIA/SEDATION: Moderate (conscious) sedation was employed during this procedure. A total of Versed 2.0 mg and Fentanyl 50 mcg was administered intravenously. Moderate Sedation Time: 74 minutes. The patient's level of consciousness and vital signs were monitored continuously by radiology nursing throughout the procedure under my direct supervision. CONTRAST:  125 cc omni 300 FLUOROSCOPY TIME:  Fluoroscopy Time: 22 minutes 48 seconds (343 mGy). COMPLICATIONS: None immediate. PROCEDURE: Informed consent was obtained from the patient following explanation of the procedure, risks, benefits and alternatives. The patient understands, agrees and consents for the procedure. All questions were addressed. A time out was performed prior to the initiation of the procedure. Maximal barrier sterile technique utilized including caps, mask, sterile gowns, sterile gloves, large sterile drape, hand hygiene, and Betadine prep. Under sterile conditions and local anesthesia, ultrasound micropuncture access performed of the patent right common femoral artery. Images obtained for documentation of the patent right common femoral artery. Five French sheath inserted over a guidewire. Initially, a C2 catheter over a Bentson guidewire was utilized to select the SMA. SMA angiogram performed. SMA: SMA is widely patent including the jejunal, colic and ileocolic branches. Suspicious hypervascularity in the right colon along the peripheral aspect of the right colic artery and marginal arteries. Repeat SMA angiogram performed with a power injection confirming active contrast extravasation/colonic bleeding from a small peripheral right colic branch in the right colon. This correlates  with the CTA. The C2 catheter exchange for a chung 2.5 catheter. This was utilized to resect the SMA origin for better purchase. Additional SMA angiogram confirms active bleeding in the right colon from a small peripheral branch from the right colic artery. Through the [facility] 2.5 catheter, a Renegade STC catheter over a fathom guidewire was advanced into the main SMA trunk. Catheter was advanced into the ileocolic branch. Ileocolic angiogram performed. Ileocolic: Ileocolic and right colic branches are patent. There are hypervascular branches from the right iliac colic and marginal artery territory. Active contrast extravasation has temporarily stopped. Microcatheter was advanced into the right colic artery. Branch of the right colic artery was catheterized. Tortuous hypervascularity noted within this vascular territory which correlates with the area of active bleeding by prior angiograms and CTA. For embolization, 2 2 mm microcoils were deployed within the peripheral right colic branch. Following this, follow-up peripheral right colic angiogram through the microcatheter demonstrates no further active bleeding or hypervascularity. This was repeated after approximately 5 minutes and there was no further active arterial bleeding in the right colon or hypervascularity demonstrated. Final repeat SMA angiogram again demonstrates no further active bleeding in the right colic vascular territory or any residual hypervascularity. Access removed. Assist and excessive device. No immediate complication. Patient tolerated the procedure well. IMPRESSION: Positive angiogram for active arterial bleeding from a small peripheral branch of the right colic artery into the right colon. This correlates with the CTA. Successful peripheral right colic artery micro coil embolization at the bleeding site as above. Electronically Signed   By: Judie Petit.  Shick  M.D.   On: 11/14/2019 08:53     LOS: 1 day   Jeoffrey Massed, MD  [facility]    To contact the attending provider between 7A-7P or the covering provider during after hours 7P-7A, please log into the web site [email or web address] and access using universal [place] password for that web site. If you do not have the password, please call the hospital operator.  11/15/2019, 3:07 PM

## 2019-11-16 ENCOUNTER — Other Ambulatory Visit: Payer: Self-pay

## 2019-11-16 LAB — MAGNESIUM: Magnesium: 1.9 mg/dL (ref 1.7–2.4)

## 2019-11-16 LAB — TYPE AND SCREEN
ABO/RH(D): A POS
Antibody Screen: NEGATIVE
Unit division: 0
Unit division: 0
Unit division: 0

## 2019-11-16 LAB — BPAM RBC
Blood Product Expiration Date: 202105282359
Blood Product Expiration Date: 202106042359
Blood Product Expiration Date: 202106042359
ISSUE DATE / TIME: 202105101138
ISSUE DATE / TIME: 202105110455
ISSUE DATE / TIME: 202105121539
Unit Type and Rh: 6200
Unit Type and Rh: 6200
Unit Type and Rh: 6200

## 2019-11-16 LAB — CBC
HCT: 27.3 % — ABNORMAL LOW (ref 36.0–46.0)
Hemoglobin: 9 g/dL — ABNORMAL LOW (ref 12.0–15.0)
MCH: 30.2 pg (ref 26.0–34.0)
MCHC: 33 g/dL (ref 30.0–36.0)
MCV: 91.6 fL (ref 80.0–100.0)
Platelets: 184 10*3/uL (ref 150–400)
RBC: 2.98 MIL/uL — ABNORMAL LOW (ref 3.87–5.11)
RDW: 14.1 % (ref 11.5–15.5)
WBC: 7 10*3/uL (ref 4.0–10.5)
nRBC: 0 % (ref 0.0–0.2)

## 2019-11-16 LAB — BASIC METABOLIC PANEL
Anion gap: 7 (ref 5–15)
BUN: 10 mg/dL (ref 8–23)
CO2: 27 mmol/L (ref 22–32)
Calcium: 8.1 mg/dL — ABNORMAL LOW (ref 8.9–10.3)
Chloride: 108 mmol/L (ref 98–111)
Creatinine, Ser: 0.54 mg/dL (ref 0.44–1.00)
GFR calc Af Amer: >60 mL/min (ref >60)
GFR calc non Af Amer: >60 mL/min (ref >60)
Glucose, Bld: 92 mg/dL (ref 70–99)
Potassium: 4 mmol/L (ref 3.5–5.1)
Sodium: 142 mmol/L (ref 135–145)

## 2019-11-16 NOTE — Progress Notes (Deleted)
PATIENT DETAILS Name: HANIA CERONE Age: 64 y.o. Sex: female Date of Birth: 29-Nov-1955 MRN: [id number]. Admitting Physician: Clydie Braun, MD [id number], Misty Stanley, MD  Admit Date: 11/13/2019 Discharge date: 11/16/2019  Recommendations for Outpatient Follow-up:  1. Follow up with PCP in 1-2 weeks 2. Please obtain CMP/CBC in one week 3. Please ensure follow-up with gastroenterology-Dr. Medoff 4. Incidental finding of a 2.3 cm nonenhancing nodule in the upper abdominal mesentery-radiology recommends a multiphase CT scan in 3 months.  Defer this to her outpatient physicians.  Admitted From:  Home  Disposition: Home   Home Health: No  Equipment/Devices: None  Discharge Condition: Stable  CODE STATUS: FULL CODE  Diet recommendation:  Diet Order            Diet - low sodium heart healthy        Diet regular Room service appropriate? Yes; Fluid consistency: Thin  Diet effective now               Brief Narrative: Patient is a 64 y.o. female with history of diverticulosis-who presented with lower GI bleeding secondary to diverticulosis.  Significant events: 5/10>> admit to [facility] for lower GI bleeding 5/10>> positive angiogram for active arterial bleeding from small peripheral branch of the right colic artery-successful peripheral right colic artery microembolization  Antimicrobial therapy: None  Microbiology data: None  Procedures : 5/10>> embolization by IR  Consults: IR, GI  Brief Hospital Course: Lower GI bleeding due to diverticulosis with acute blood loss anemia:  No further bleeding-hemoglobin stable-received around 2 units of PRBC transfusion.  Underwent embolization by IR.  On day of discharge she feels much better-she has mostly brown stools with some melanotic appearing stools at times-which probably is a residue of her recent bleeding.  CBC is stable.  She has been asked to monitor stools closely-if her melanotic stools were to be persistent  or worsening-she needs to seek immediate medical attention.  Hypokalemia: Repleted  HTN: Blood pressure slowly creeping up-resume HCTZ on discharge.  Incidental finding of a 2.3 cm nodule in the upper mesentery: See radiology report below-please repeat CT scan as instructed.   Discharge Diagnoses:  Principal Problem:   Lower GI bleed Active Problems:   Acute blood loss anemia   Arthritis   Diverticulosis   Rectal bleeding   Discharge Instructions:  Activity:  As tolerated  Discharge Instructions    Call MD for:   Complete by: As directed    If you have bright red blood or black tarry stools   Diet - low sodium heart healthy   Complete by: As directed    Discharge instructions   Complete by: As directed    1.  Incidental finding of a 2.3 cm nodule in the upper abdominal mesentery on the CT scan done while in the hospital.  This could be a cyst-radiology recommends that you have a repeat CT scan in 3 months.  Please ask your primary care practitioner or your primary gastroenterologist about this at your next follow-up.   Follow with Primary MD  Sigmund Hazel, MD in 1-2 weeks  Follow-up with Dr. Lottie Mussel in 1 week  Please get a complete blood count and chemistry panel checked by your Primary MD at your next visit, and again as instructed by your Primary MD.  Get Medicines reviewed and adjusted: Please take all your medications with you for your next visit with your Primary MD  Laboratory/radiological data: Please request your Primary MD to go over all hospital  tests and procedure/radiological results at the follow up, please ask your Primary MD to get all Hospital records sent to his/her office.  In some cases, they will be blood work, cultures and biopsy results pending at the time of your discharge. Please request that your primary care M.D. follows up on these results.  Also Note the following: If you experience worsening of your admission symptoms, develop shortness of  breath, life threatening emergency, suicidal or homicidal thoughts you must seek medical attention immediately by calling 911 or calling your MD immediately  if symptoms less severe.  You must read complete instructions/literature along with all the possible adverse reactions/side effects for all the Medicines you take and that have been prescribed to you. Take any new Medicines after you have completely understood and accpet all the possible adverse reactions/side effects.   Do not drive when taking Pain medications or sleeping medications (Benzodaizepines)  Do not take more than prescribed Pain, Sleep and Anxiety Medications. It is not advisable to combine anxiety,sleep and pain medications without talking with your primary care practitioner  Special Instructions: If you have smoked or chewed Tobacco  in the last 2 yrs please stop smoking, stop any regular Alcohol  and or any Recreational drug use.  Wear Seat belts while driving.  Please note: You were cared for by a hospitalist during your hospital stay. Once you are discharged, your primary care physician will handle any further medical issues. Please note that NO REFILLS for any discharge medications will be authorized once you are discharged, as it is imperative that you return to your primary care physician (or establish a relationship with a primary care physician if you do not have one) for your post hospital discharge needs so that they can reassess your need for medications and monitor your lab values.   Increase activity slowly   Complete by: As directed      Allergies as of 11/16/2019   No Known Allergies     Medication List    STOP taking these medications   ibuprofen 200 MG tablet Commonly known as: ADVIL     TAKE these medications   estradiol 2 MG tablet Commonly known as: ESTRACE Take 2 mg by mouth daily.   hydrochlorothiazide 12.5 MG tablet Commonly known as: HYDRODIURIL Take 12.5 mg by mouth daily.   OVER THE  COUNTER MEDICATION Take 1 tablet by mouth daily as needed. OTC allergy medicine   oxyCODONE 5 MG immediate release tablet Commonly known as: Oxy IR/ROXICODONE Take 5-10 mg by mouth every 6 (six) hours as needed for pain.   Prometrium 100 MG capsule Generic drug: progesterone Take 100 mg by mouth See admin instructions. Take 1 capsule (100mg ) by mouth for 10 days. Every 2 months      Follow-up Information    Kathyrn Lass, MD. Schedule an appointment as soon as possible for a visit in 1 week(s).   Specialty: Family Medicine Contact information: [place] [address] [phone]        Richmond Campbell, MD. Schedule an appointment as soon as possible for a visit in 1 week(s).   Specialty: Gastroenterology Contact information: [place] [address] [phone]          No Known Allergies    Other Procedures/Studies: IR Angiogram Visceral Selective  Result Date: 11/14/2019 INDICATION: Acute lower GI bleeding, positive CTA for right colon active arterial bleeding. EXAM: Ultrasound guidance for vascular access SMA catheterization and angiograms Ileocolic and right  colic peripheral micro catheterization and angiograms Peripheral right colic micro catheterization, angiogram, and micro coil embolization for active bleeding. MEDICATIONS: 1% lidocaine local. ANESTHESIA/SEDATION: Moderate (conscious) sedation was employed during this procedure. A total of Versed 2.0 mg and Fentanyl 50 mcg was administered intravenously. Moderate Sedation Time: 74 minutes. The patient's level of consciousness and vital signs were monitored continuously by radiology nursing throughout the procedure under my direct supervision. CONTRAST:  125 cc omni 300 FLUOROSCOPY TIME:  Fluoroscopy Time: 22 minutes 48 seconds (343 mGy). COMPLICATIONS: None immediate. PROCEDURE: Informed consent was obtained from the patient following explanation of the procedure, risks, benefits and  alternatives. The patient understands, agrees and consents for the procedure. All questions were addressed. A time out was performed prior to the initiation of the procedure. Maximal barrier sterile technique utilized including caps, mask, sterile gowns, sterile gloves, large sterile drape, hand hygiene, and Betadine prep. Under sterile conditions and local anesthesia, ultrasound micropuncture access performed of the patent right common femoral artery. Images obtained for documentation of the patent right common femoral artery. Five French sheath inserted over a guidewire. Initially, a C2 catheter over a Bentson guidewire was utilized to select the SMA. SMA angiogram performed. SMA: SMA is widely patent including the jejunal, colic and ileocolic branches. Suspicious hypervascularity in the right colon along the peripheral aspect of the right colic artery and marginal arteries. Repeat SMA angiogram performed with a power injection confirming active contrast extravasation/colonic bleeding from a small peripheral right colic branch in the right colon. This correlates with the CTA. The C2 catheter exchange for a chung 2.5 catheter. This was utilized to resect the SMA origin for better purchase. Additional SMA angiogram confirms active bleeding in the right colon from a small peripheral branch from the right colic artery. Through the [facility] 2.5 catheter, a Renegade STC catheter over a fathom guidewire was advanced into the main SMA trunk. Catheter was advanced into the ileocolic branch. Ileocolic angiogram performed. Ileocolic: Ileocolic and right colic branches are patent. There are hypervascular branches from the right iliac colic and marginal artery territory. Active contrast extravasation has temporarily stopped. Microcatheter was advanced into the right colic artery. Branch of the right colic artery was catheterized. Tortuous hypervascularity noted within this vascular territory which correlates with the area of  active bleeding by prior angiograms and CTA. For embolization, 2 2 mm microcoils were deployed within the peripheral right colic branch. Following this, follow-up peripheral right colic angiogram through the microcatheter demonstrates no further active bleeding or hypervascularity. This was repeated after approximately 5 minutes and there was no further active arterial bleeding in the right colon or hypervascularity demonstrated. Final repeat SMA angiogram again demonstrates no further active bleeding in the right colic vascular territory or any residual hypervascularity. Access removed. Assist and excessive device. No immediate complication. Patient tolerated the procedure well. IMPRESSION: Positive angiogram for active arterial bleeding from a small peripheral branch of the right colic artery into the right colon. This correlates with the CTA. Successful peripheral right colic artery micro coil embolization at the bleeding site as above. Electronically Signed   By: Judie Petit.  Shick M.D.   On: 11/14/2019 08:53   IR Angiogram Selective Each Additional Vessel  Result Date: 11/14/2019 INDICATION: Acute lower GI bleeding, positive CTA for right colon active arterial bleeding. EXAM: Ultrasound guidance for vascular access SMA catheterization and angiograms Ileocolic and right colic peripheral micro catheterization and angiograms Peripheral right colic micro catheterization, angiogram, and micro coil embolization for active bleeding. MEDICATIONS: 1% lidocaine  local. ANESTHESIA/SEDATION: Moderate (conscious) sedation was employed during this procedure. A total of Versed 2.0 mg and Fentanyl 50 mcg was administered intravenously. Moderate Sedation Time: 74 minutes. The patient's level of consciousness and vital signs were monitored continuously by radiology nursing throughout the procedure under my direct supervision. CONTRAST:  125 cc omni 300 FLUOROSCOPY TIME:  Fluoroscopy Time: 22 minutes 48 seconds (343 mGy).  COMPLICATIONS: None immediate. PROCEDURE: Informed consent was obtained from the patient following explanation of the procedure, risks, benefits and alternatives. The patient understands, agrees and consents for the procedure. All questions were addressed. A time out was performed prior to the initiation of the procedure. Maximal barrier sterile technique utilized including caps, mask, sterile gowns, sterile gloves, large sterile drape, hand hygiene, and Betadine prep. Under sterile conditions and local anesthesia, ultrasound micropuncture access performed of the patent right common femoral artery. Images obtained for documentation of the patent right common femoral artery. Five French sheath inserted over a guidewire. Initially, a C2 catheter over a Bentson guidewire was utilized to select the SMA. SMA angiogram performed. SMA: SMA is widely patent including the jejunal, colic and ileocolic branches. Suspicious hypervascularity in the right colon along the peripheral aspect of the right colic artery and marginal arteries. Repeat SMA angiogram performed with a power injection confirming active contrast extravasation/colonic bleeding from a small peripheral right colic branch in the right colon. This correlates with the CTA. The C2 catheter exchange for a chung 2.5 catheter. This was utilized to resect the SMA origin for better purchase. Additional SMA angiogram confirms active bleeding in the right colon from a small peripheral branch from the right colic artery. Through the [facility] 2.5 catheter, a Renegade STC catheter over a fathom guidewire was advanced into the main SMA trunk. Catheter was advanced into the ileocolic branch. Ileocolic angiogram performed. Ileocolic: Ileocolic and right colic branches are patent. There are hypervascular branches from the right iliac colic and marginal artery territory. Active contrast extravasation has temporarily stopped. Microcatheter was advanced into the right colic artery.  Branch of the right colic artery was catheterized. Tortuous hypervascularity noted within this vascular territory which correlates with the area of active bleeding by prior angiograms and CTA. For embolization, 2 2 mm microcoils were deployed within the peripheral right colic branch. Following this, follow-up peripheral right colic angiogram through the microcatheter demonstrates no further active bleeding or hypervascularity. This was repeated after approximately 5 minutes and there was no further active arterial bleeding in the right colon or hypervascularity demonstrated. Final repeat SMA angiogram again demonstrates no further active bleeding in the right colic vascular territory or any residual hypervascularity. Access removed. Assist and excessive device. No immediate complication. Patient tolerated the procedure well. IMPRESSION: Positive angiogram for active arterial bleeding from a small peripheral branch of the right colic artery into the right colon. This correlates with the CTA. Successful peripheral right colic artery micro coil embolization at the bleeding site as above. Electronically Signed   By: Judie Petit.  Shick M.D.   On: 11/14/2019 08:53   IR US Guide Vasc Access Right  Result Date: 11/14/2019 INDICATION: Acute lower GI bleeding, positive CTA for right colon active arterial bleeding. EXAM: Ultrasound guidance for vascular access SMA catheterization and angiograms Ileocolic and right colic peripheral micro catheterization and angiograms Peripheral right colic micro catheterization, angiogram, and micro coil embolization for active bleeding. MEDICATIONS: 1% lidocaine local. ANESTHESIA/SEDATION: Moderate (conscious) sedation was employed during this procedure. A total of Versed 2.0 mg and Fentanyl 50 mcg was administered  intravenously. Moderate Sedation Time: 74 minutes. The patient's level of consciousness and vital signs were monitored continuously by radiology nursing throughout the procedure under  my direct supervision. CONTRAST:  125 cc omni 300 FLUOROSCOPY TIME:  Fluoroscopy Time: 22 minutes 48 seconds (343 mGy). COMPLICATIONS: None immediate. PROCEDURE: Informed consent was obtained from the patient following explanation of the procedure, risks, benefits and alternatives. The patient understands, agrees and consents for the procedure. All questions were addressed. A time out was performed prior to the initiation of the procedure. Maximal barrier sterile technique utilized including caps, mask, sterile gowns, sterile gloves, large sterile drape, hand hygiene, and Betadine prep. Under sterile conditions and local anesthesia, ultrasound micropuncture access performed of the patent right common femoral artery. Images obtained for documentation of the patent right common femoral artery. Five French sheath inserted over a guidewire. Initially, a C2 catheter over a Bentson guidewire was utilized to select the SMA. SMA angiogram performed. SMA: SMA is widely patent including the jejunal, colic and ileocolic branches. Suspicious hypervascularity in the right colon along the peripheral aspect of the right colic artery and marginal arteries. Repeat SMA angiogram performed with a power injection confirming active contrast extravasation/colonic bleeding from a small peripheral right colic branch in the right colon. This correlates with the CTA. The C2 catheter exchange for a chung 2.5 catheter. This was utilized to resect the SMA origin for better purchase. Additional SMA angiogram confirms active bleeding in the right colon from a small peripheral branch from the right colic artery. [facility], a Renegade STC catheter over a fathom guidewire was advanced into the main SMA trunk. Catheter was advanced into the ileocolic branch. Ileocolic angiogram performed. Ileocolic: Ileocolic and right colic branches are patent. There are hypervascular branches from the right iliac colic and marginal artery  territory. Active contrast extravasation has temporarily stopped. Microcatheter was advanced into the right colic artery. Branch of the right colic artery was catheterized. Tortuous hypervascularity noted within this vascular territory which correlates with the area of active bleeding by prior angiograms and CTA. For embolization, 2 2 mm microcoils were deployed within the peripheral right colic branch. Following this, follow-up peripheral right colic angiogram through the microcatheter demonstrates no further active bleeding or hypervascularity. This was repeated after approximately 5 minutes and there was no further active arterial bleeding in the right colon or hypervascularity demonstrated. Final repeat SMA angiogram again demonstrates no further active bleeding in the right colic vascular territory or any residual hypervascularity. Access removed. Assist and excessive device. No immediate complication. Patient tolerated the procedure well. IMPRESSION: Positive angiogram for active arterial bleeding from a small peripheral branch of the right colic artery into the right colon. This correlates with the CTA. Successful peripheral right colic artery micro coil embolization at the bleeding site as above. Electronically Signed   By: Judie PetitM.  Shick M.D.   On: 11/14/2019 08:53   IR EMBO ART  VEN HEMORR LYMPH EXTRAV  INC GUIDE ROADMAPPING  Result Date: 11/14/2019 INDICATION: Acute lower GI bleeding, positive CTA for right colon active arterial bleeding. EXAM: Ultrasound guidance for vascular access SMA catheterization and angiograms Ileocolic and right colic peripheral micro catheterization and angiograms Peripheral right colic micro catheterization, angiogram, and micro coil embolization for active bleeding. MEDICATIONS: 1% lidocaine local. ANESTHESIA/SEDATION: Moderate (conscious) sedation was employed during this procedure. A total of Versed 2.0 mg and Fentanyl 50 mcg was administered intravenously. Moderate Sedation  Time: 74 minutes. The patient's level of consciousness and vital signs were  monitored continuously by radiology nursing throughout the procedure under my direct supervision. CONTRAST:  125 cc omni 300 FLUOROSCOPY TIME:  Fluoroscopy Time: 22 minutes 48 seconds (343 mGy). COMPLICATIONS: None immediate. PROCEDURE: Informed consent was obtained from the patient following explanation of the procedure, risks, benefits and alternatives. The patient understands, agrees and consents for the procedure. All questions were addressed. A time out was performed prior to the initiation of the procedure. Maximal barrier sterile technique utilized including caps, mask, sterile gowns, sterile gloves, large sterile drape, hand hygiene, and Betadine prep. Under sterile conditions and local anesthesia, ultrasound micropuncture access performed of the patent right common femoral artery. Images obtained for documentation of the patent right common femoral artery. Five French sheath inserted over a guidewire. Initially, a C2 catheter over a Bentson guidewire was utilized to select the SMA. SMA angiogram performed. SMA: SMA is widely patent including the jejunal, colic and ileocolic branches. Suspicious hypervascularity in the right colon along the peripheral aspect of the right colic artery and marginal arteries. Repeat SMA angiogram performed with a power injection confirming active contrast extravasation/colonic bleeding from a small peripheral right colic branch in the right colon. This correlates with the CTA. The C2 catheter exchange for a chung 2.5 catheter. This was utilized to resect the SMA origin for better purchase. Additional SMA angiogram confirms active bleeding in the right colon from a small peripheral branch from the right colic artery. Through the [facility] 2.5 catheter, a Renegade STC catheter over a fathom guidewire was advanced into the main SMA trunk. Catheter was advanced into the ileocolic branch. Ileocolic angiogram  performed. Ileocolic: Ileocolic and right colic branches are patent. There are hypervascular branches from the right iliac colic and marginal artery territory. Active contrast extravasation has temporarily stopped. Microcatheter was advanced into the right colic artery. Branch of the right colic artery was catheterized. Tortuous hypervascularity noted within this vascular territory which correlates with the area of active bleeding by prior angiograms and CTA. For embolization, 2 2 mm microcoils were deployed within the peripheral right colic branch. Following this, follow-up peripheral right colic angiogram through the microcatheter demonstrates no further active bleeding or hypervascularity. This was repeated after approximately 5 minutes and there was no further active arterial bleeding in the right colon or hypervascularity demonstrated. Final repeat SMA angiogram again demonstrates no further active bleeding in the right colic vascular territory or any residual hypervascularity. Access removed. Assist and excessive device. No immediate complication. Patient tolerated the procedure well. IMPRESSION: Positive angiogram for active arterial bleeding from a small peripheral branch of the right colic artery into the right colon. This correlates with the CTA. Successful peripheral right colic artery micro coil embolization at the bleeding site as above. Electronically Signed   By: Judie Petit.  Shick M.D.   On: 11/14/2019 08:53   CT Angio Abd/Pel W and/or Wo Contrast  Result Date: 11/13/2019 CLINICAL DATA:  Significant bloody stools this morning. Evaluate for source of GI bleeding. EXAM: CTA ABDOMEN AND PELVIS WITHOUT AND WITH CONTRAST TECHNIQUE: Multidetector CT imaging of the abdomen and pelvis was performed using the standard protocol during bolus administration of intravenous contrast. Multiplanar reconstructed images and MIPs were obtained and reviewed to evaluate the vascular anatomy. CONTRAST:  OMNIPAQUE IOHEXOL  350 MG/ML SOLN COMPARISON:  None. FINDINGS: VASCULAR Aorta: Normal caliber of the abdominal aorta. No evidence of abdominal aortic dissection or periaortic stranding. Celiac: Widely patent without a hemodynamically significant narrowing. Mild ectasia of the splenic artery at the level of the  hilum measuring approximately 0.8 cm in diameter (coronal image 69, series 5), of doubtful clinical concern. SMA: Widely patent without a hemodynamically significant narrowing. The distal tributaries of the SMA appear widely patent without discrete intraluminal filling defect to suggest distal embolism. Pooling intraluminal contrast extravasation is seen within the mid ascending colon via a tertiary branch/arcade of the right colic artery. Renals: Solitary bilaterally; widely patent without hemodynamically significant narrowing. No vessel irregularity to suggest FMD. IMA: Widely patent without a hemodynamically significant narrowing. Inflow: The bilateral common, external and internal iliac arteries are of normal caliber and widely patent without hemodynamically significant narrowing. Proximal Outflow: The bilateral common and imaged portions of the bilateral deep and superficial femoral arteries are widely patent without hemodynamically significant narrowing. Veins: The IVC and pelvic venous systems appear widely patent. Note is made of a retroaortic left renal vein. Review of the MIP images confirms the above findings. _________________________________________________________ NON-VASCULAR Lower chest: Limited visualization of the lower thorax demonstrates minimal dependent subpleural ground-glass atelectasis. No discrete focal airspace opacities. No pleural effusion Normal heart size.  No pericardial effusion. Hepatobiliary: Normal hepatic contour. 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 Normal appearance of the gallbladder. No radiopaque gallstones. No intra or extrahepatic biliary ductal dilatation. No ascites. Pancreas: Normal appearance of the pancreas. Spleen: Normal appearance of the spleen. Note is made of a small splenule about the anterior tip of the spleen. Adrenals/Urinary Tract: Review of the precontrast images demonstrate excretion of contrast within the bilateral renal collecting systems. No definite renal stones. No discrete renal lesions. No urine obstruction or perinephric stranding. Normal appearance the bilateral adrenal glands. Normal appearance of the urinary bladder given degree of distention. Stomach/Bowel: There is pooling of intraluminal contrast within the mid aspect of the ascending colon (axial image 67, series 6; image 41, series 12; coronal image 44, series 6), compatible with an area of active lower GI bleeding. Rather extensive colonic diverticulosis is seen throughout the colon, including at the location of the active GI bleeding. No discrete colonic mass or evidence of enteric obstruction. Normal appearance of the terminal ileum and the retrocecal appendix. No pneumoperitoneum, pneumatosis or portal venous gas. Lymphatic: While there is no bulky retroperitoneal, mesenteric, pelvic or inguinal lymphadenopathy, note is made of an approximately 2.3 x 1.9 x 1.9 cm well-defined nonenhancing nodule within the midline of the upper abdominal mesentery (image 52, series 6; image 31, series 12; coronal image 28, series 8). Reproductive: Mildly enlarged but otherwise normal-appearing uterus. No discrete adnexal lesion. No free fluid in the pelvic cul-de-sac. Other: Regional soft tissues appear normal. Musculoskeletal: No acute or aggressive osseous abnormalities. Mild-to-moderate multilevel lumbar spine DDD, worse at L4-L5 and to a lesser extent, L2-L3 with disc space height loss, endplate irregularity and sclerosis. Post right total hip replacement  with age-indeterminate ill-defined lucency surrounding the acetabular component. 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 IMPRESSION: 1. 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 2. Normal caliber of the abdominal aorta without significant atherosclerotic plaque. 3. 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 Comparison with prior outside examinations (  if available), is advised. Otherwise, follow-up multiphase CT scan in 3 months could be performed for further evaluation as indicated. 4. Post right total hip replacement with age-indeterminate ill-defined lucency surrounding the acetabular component, nonspecific though could be seen in the setting hardware loosening. Clinical correlation is advised. 5. Mild to moderate multilevel lumbar spine DDD. 6. 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 Critical Value/emergent results were called by telephone at the time of interpretation on 11/13/2019 at 4:17 pm to provider Dr. Myrtie Neither, who verbally acknowledged these results. Electronically Signed   By: Simonne Come M.D.   On: 11/13/2019 16:42     TODAY-DAY OF DISCHARGE:  Subjective:   Jessika Rothery today has no headache,no chest abdominal pain,no new weakness tingling or numbness, feels much better wants to go home today.   Objective:   Blood pressure 121/60, pulse 76, temperature 97.8 F (36.6 C), resp. rate 18, height  (1.6 m), weight 63.3 kg, SpO2 98 %.  Intake/Output Summary (Last 24 hours) at 11/16/2019 1010 Last data filed at 11/15/2019 1900 Gross per 24 hour  Intake 799.67 ml  Output --  Net 799.67 ml   Filed Weights    11/13/19 1014 11/13/19 2131 11/15/19 0351  Weight: 62.6 kg 63.1 kg 63.3 kg    Exam: Awake Alert, Oriented *3, No new F.N deficits, Normal affect [place].AT,PERRAL Supple Neck,No JVD, No cervical lymphadenopathy appriciated.  Symmetrical Chest wall movement, Good air movement bilaterally, CTAB RRR,No Gallops,Rubs or new Murmurs, No Parasternal Heave +ve B.Sounds, Abd Soft, Non tender, No organomegaly appriciated, No rebound -guarding or rigidity. No Cyanosis, Clubbing or edema, No new Rash or bruise   PERTINENT RADIOLOGIC STUDIES: No results found.   PERTINENT LAB RESULTS: CBC: Recent Labs    11/15/19 0355 11/15/19 1208 11/15/19 2041 11/16/19 0537  WBC 8.1  --   --  7.0  HGB 7.1*   < > 8.6* 9.0*  HCT 21.9*   < > 25.9* 27.3*  PLT 139*  --   --  184   < > = values in this interval not displayed.   CMET CMP     Component Value Date/Time   NA 142 11/16/2019 0537   K 4.0 11/16/2019 0537   CL 108 11/16/2019 0537   CO2 27 11/16/2019 0537   GLUCOSE 92 11/16/2019 0537   BUN 10 11/16/2019 0537   CREATININE 0.54 11/16/2019 0537   CALCIUM 8.1 (L) 11/16/2019 0537   PROT 5.4 (L) 11/13/2019 1015   ALBUMIN 2.6 (L) 11/13/2019 1015   AST 16 11/13/2019 1015   ALT 13 11/13/2019 1015   ALKPHOS 74 11/13/2019 1015   BILITOT 0.6 11/13/2019 1015   GFRNONAA >60 11/16/2019 0537   GFRAA >60 11/16/2019 0537    GFR Estimated Creatinine Clearance: 65.4 mL/min (by C-G formula based on SCr of 0.54 mg/dL). No results for input(s): LIPASE, AMYLASE in the last 72 hours. No results for input(s): CKTOTAL, CKMB, CKMBINDEX, TROPONINI in the last 72 hours. Invalid input(s): POCBNP No results for input(s): DDIMER in the last 72 hours. No results for input(s): HGBA1C in the last 72 hours. No results for input(s): CHOL, HDL, LDLCALC, TRIG, CHOLHDL, LDLDIRECT in the last 72 hours. No results for input(s): TSH, T4TOTAL, T3FREE, THYROIDAB in the last 72 hours.  Invalid input(s): FREET3 No results for  input(s): VITAMINB12, FOLATE, FERRITIN, TIBC, IRON, RETICCTPCT in the last 72 hours. Coags: Recent Labs    11/13/19 1015  INR 1.1   Microbiology: Recent Results (from the past 240  hour(s))  SARS Coronavirus 2 by RT PCR (hospital order, performed in [facility] hospital lab) Nasopharyngeal Nasopharyngeal Swab     Status: None   Collection Time: 11/13/19 10:33 AM   Specimen: Nasopharyngeal Swab  Result Value Ref Range Status   SARS Coronavirus 2 NEGATIVE NEGATIVE Final    Comment: (NOTE) SARS-CoV-2 target nucleic acids are NOT DETECTED. The SARS-CoV-2 RNA is generally detectable in upper and lower respiratory specimens during the acute phase of infection. The lowest concentration of SARS-CoV-2 viral copies this assay can detect is 250 copies / mL. A negative result does not preclude SARS-CoV-2 infection and should not be used as the sole basis for treatment or other patient management decisions.  A negative result may occur with improper specimen collection / handling, submission of specimen other than nasopharyngeal swab, presence of viral mutation(s) within the areas targeted by this assay, and inadequate number of viral copies (<250 copies / mL). A negative result must be combined with clinical observations, patient history, and epidemiological information. Fact Sheet for Patients:   StrictlyIdeas.no Fact Sheet for Healthcare Providers: BankingDealers.co.za This test is not yet approved or cleared  by the [place] FDA and has been authorized for detection and/or diagnosis of SARS-CoV-2 by FDA under an Emergency Use Authorization (EUA).  This EUA will remain in effect (meaning this test can be used) for the duration of the COVID-19 declaration under Section 564(b)(1) of the Act, 21 U.S.C. section 360bbb-3(b)(1), unless the authorization is terminated or revoked sooner. Performed at [facility], [place] [address],  [place], [place] [address]     FURTHER DISCHARGE INSTRUCTIONS:  Get Medicines reviewed and adjusted: Please take all your medications with you for your next visit with your Primary MD  Laboratory/radiological data: Please request your Primary MD to go over all hospital tests and procedure/radiological results at the follow up, please ask your Primary MD to get all Hospital records sent to his/her office.  In some cases, they will be blood work, cultures and biopsy results pending at the time of your discharge. Please request that your primary care M.D. goes through all the records of your hospital data and follows up on these results.  Also Note the following: If you experience worsening of your admission symptoms, develop shortness of breath, life threatening emergency, suicidal or homicidal thoughts you must seek medical attention immediately by calling 911 or calling your MD immediately  if symptoms less severe.  You must read complete instructions/literature along with all the possible adverse reactions/side effects for all the Medicines you take and that have been prescribed to you. Take any new Medicines after you have completely understood and accpet all the possible adverse reactions/side effects.   Do not drive when taking Pain medications or sleeping medications (Benzodaizepines)  Do not take more than prescribed Pain, Sleep and Anxiety Medications. It is not advisable to combine anxiety,sleep and pain medications without talking with your primary care practitioner  Special Instructions: If you have smoked or chewed Tobacco  in the last 2 yrs please stop smoking, stop any regular Alcohol  and or any Recreational drug use.  Wear Seat belts while driving.  Please note: You were cared for by a hospitalist during your hospital stay. Once you are discharged, your primary care physician will handle any further medical issues. Please note that NO REFILLS for any discharge medications will  be authorized once you are discharged, as it is imperative that you return to your primary care physician (or  establish a relationship with a primary care physician if you do not have one) for your post hospital discharge needs so that they can reassess your need for medications and monitor your lab values.  Total Time spent coordinating discharge including counseling, education and face to face time equals 35 minutes.  Signed: Bethenny Losee 11/16/2019 10:10 AM

## 2019-11-16 NOTE — Discharge Summary (Signed)
PATIENT DETAILS Name: Lindsey Stone Age: 64 y.o. Sex: female Date of Birth: [birth year] MRN: [id number]. Admitting Physician: Clydie Braun, MD [id number], Misty Stanley, MD  Admit Date: 11/13/2019 Discharge date: 11/16/2019  Recommendations for Outpatient Follow-up:  1. Follow up with PCP in 1-2 weeks 2. Please obtain CMP/CBC in one week 3. Please ensure follow-up with gastroenterology-Dr. Medoff 4. Incidental finding of a 2.3 cm nonenhancing nodule in the upper abdominal mesentery-radiology recommends a multiphase CT scan in 3 months.  Defer this to her outpatient physicians.  Admitted From:  Home  Disposition: Home   Home Health: No  Equipment/Devices: None  Discharge Condition: Stable  CODE STATUS: FULL CODE  Diet recommendation:  Diet Order            Diet - low sodium heart healthy        Diet regular Room service appropriate? Yes; Fluid consistency: Thin  Diet effective now               Brief Narrative: Patient is a 64 y.o. female with history of diverticulosis-who presented with lower GI bleeding secondary to diverticulosis.  Significant events: 5/10>> admit to [facility] for lower GI bleeding 5/10>> positive angiogram for active arterial bleeding from small peripheral branch of the right colic artery-successful peripheral right colic artery microembolization  Antimicrobial therapy: None  Microbiology data: None  Procedures : 5/10>> embolization by IR  Consults: IR, GI  Brief Hospital Course: Lower GI bleeding due to diverticulosis with acute blood loss anemia:  No further bleeding-hemoglobin stable-received around 2 units of PRBC transfusion.  Underwent embolization by IR.  On day of discharge she feels much better-she has mostly brown stools with some melanotic appearing stools at times-which probably is a residue of her recent bleeding.  CBC is stable.  She has been asked to monitor stools closely-if her melanotic stools were to be persistent  or worsening-she needs to seek immediate medical attention.  Hypokalemia: Repleted  HTN: Blood pressure slowly creeping up-resume HCTZ on discharge.  Incidental finding of a 2.3 cm nodule in the upper mesentery: See radiology report below-please repeat CT scan as instructed.   Discharge Diagnoses:  Principal Problem:   Lower GI bleed Active Problems:   Acute blood loss anemia   Arthritis   Diverticulosis   Rectal bleeding   Discharge Instructions:  Activity:  As tolerated  Discharge Instructions    Call MD for:   Complete by: As directed    If you have bright red blood or black tarry stools   Diet - low sodium heart healthy   Complete by: As directed    Discharge instructions   Complete by: As directed    1.  Incidental finding of a 2.3 cm nodule in the upper abdominal mesentery on the CT scan done while in the hospital.  This could be a cyst-radiology recommends that you have a repeat CT scan in 3 months.  Please ask your primary care practitioner or your primary gastroenterologist about this at your next follow-up.   Follow with Primary MD  Sigmund Hazel, MD in 1-2 weeks  Follow-up with Dr. Lottie Mussel in 1 week  Please get a complete blood count and chemistry panel checked by your Primary MD at your next visit, and again as instructed by your Primary MD.  Get Medicines reviewed and adjusted: Please take all your medications with you for your next visit with your Primary MD  Laboratory/radiological data: Please request your Primary MD to go over all hospital  tests and procedure/radiological results at the follow up, please ask your Primary MD to get all Hospital records sent to his/her office.  In some cases, they will be blood work, cultures and biopsy results pending at the time of your discharge. Please request that your primary care M.D. follows up on these results.  Also Note the following: If you experience worsening of your admission symptoms, develop shortness of  breath, life threatening emergency, suicidal or homicidal thoughts you must seek medical attention immediately by calling 911 or calling your MD immediately  if symptoms less severe.  You must read complete instructions/literature along with all the possible adverse reactions/side effects for all the Medicines you take and that have been prescribed to you. Take any new Medicines after you have completely understood and accpet all the possible adverse reactions/side effects.   Do not drive when taking Pain medications or sleeping medications (Benzodaizepines)  Do not take more than prescribed Pain, Sleep and Anxiety Medications. It is not advisable to combine anxiety,sleep and pain medications without talking with your primary care practitioner  Special Instructions: If you have smoked or chewed Tobacco  in the last 2 yrs please stop smoking, stop any regular Alcohol  and or any Recreational drug use.  Wear Seat belts while driving.  Please note: You were cared for by a hospitalist during your hospital stay. Once you are discharged, your primary care physician will handle any further medical issues. Please note that NO REFILLS for any discharge medications will be authorized once you are discharged, as it is imperative that you return to your primary care physician (or establish a relationship with a primary care physician if you do not have one) for your post hospital discharge needs so that they can reassess your need for medications and monitor your lab values.   Increase activity slowly   Complete by: As directed      Allergies as of 11/16/2019   No Known Allergies     Medication List    STOP taking these medications   ibuprofen 200 MG tablet Commonly known as: ADVIL     TAKE these medications   estradiol 2 MG tablet Commonly known as: ESTRACE Take 2 mg by mouth daily.   hydrochlorothiazide 12.5 MG tablet Commonly known as: HYDRODIURIL Take 12.5 mg by mouth daily.   OVER THE  COUNTER MEDICATION Take 1 tablet by mouth daily as needed. OTC allergy medicine   oxyCODONE 5 MG immediate release tablet Commonly known as: Oxy IR/ROXICODONE Take 5-10 mg by mouth every 6 (six) hours as needed for pain.   Prometrium 100 MG capsule Generic drug: progesterone Take 100 mg by mouth See admin instructions. Take 1 capsule (100mg ) by mouth for 10 days. Every 2 months      Follow-up Information    Kathyrn Lass, MD. Schedule an appointment as soon as possible for a visit in 1 week(s).   Specialty: Family Medicine Contact information: [place] [address] [phone]        Richmond Campbell, MD. Schedule an appointment as soon as possible for a visit in 1 week(s).   Specialty: Gastroenterology Contact information: [place] [address] [phone]          No Known Allergies    Other Procedures/Studies: IR Angiogram Visceral Selective  Result Date: 11/14/2019 INDICATION: Acute lower GI bleeding, positive CTA for right colon active arterial bleeding. EXAM: Ultrasound guidance for vascular access SMA catheterization and angiograms Ileocolic and right  colic peripheral micro catheterization and angiograms Peripheral right colic micro catheterization, angiogram, and micro coil embolization for active bleeding. MEDICATIONS: 1% lidocaine local. ANESTHESIA/SEDATION: Moderate (conscious) sedation was employed during this procedure. A total of Versed 2.0 mg and Fentanyl 50 mcg was administered intravenously. Moderate Sedation Time: 74 minutes. The patient's level of consciousness and vital signs were monitored continuously by radiology nursing throughout the procedure under my direct supervision. CONTRAST:  125 cc omni 300 FLUOROSCOPY TIME:  Fluoroscopy Time: 22 minutes 48 seconds (343 mGy). COMPLICATIONS: None immediate. PROCEDURE: Informed consent was obtained from the patient following explanation of the procedure, risks, benefits and  alternatives. The patient understands, agrees and consents for the procedure. All questions were addressed. A time out was performed prior to the initiation of the procedure. Maximal barrier sterile technique utilized including caps, mask, sterile gowns, sterile gloves, large sterile drape, hand hygiene, and Betadine prep. Under sterile conditions and local anesthesia, ultrasound micropuncture access performed of the patent right common femoral artery. Images obtained for documentation of the patent right common femoral artery. Five French sheath inserted over a guidewire. Initially, a C2 catheter over a Bentson guidewire was utilized to select the SMA. SMA angiogram performed. SMA: SMA is widely patent including the jejunal, colic and ileocolic branches. Suspicious hypervascularity in the right colon along the peripheral aspect of the right colic artery and marginal arteries. Repeat SMA angiogram performed with a power injection confirming active contrast extravasation/colonic bleeding from a small peripheral right colic branch in the right colon. This correlates with the CTA. The C2 catheter exchange for a chung 2.5 catheter. This was utilized to resect the SMA origin for better purchase. Additional SMA angiogram confirms active bleeding in the right colon from a small peripheral branch from the right colic artery. Through the [facility] 2.5 catheter, a Renegade STC catheter over a fathom guidewire was advanced into the main SMA trunk. Catheter was advanced into the ileocolic branch. Ileocolic angiogram performed. Ileocolic: Ileocolic and right colic branches are patent. There are hypervascular branches from the right iliac colic and marginal artery territory. Active contrast extravasation has temporarily stopped. Microcatheter was advanced into the right colic artery. Branch of the right colic artery was catheterized. Tortuous hypervascularity noted within this vascular territory which correlates with the area of  active bleeding by prior angiograms and CTA. For embolization, 2 2 mm microcoils were deployed within the peripheral right colic branch. Following this, follow-up peripheral right colic angiogram through the microcatheter demonstrates no further active bleeding or hypervascularity. This was repeated after approximately 5 minutes and there was no further active arterial bleeding in the right colon or hypervascularity demonstrated. Final repeat SMA angiogram again demonstrates no further active bleeding in the right colic vascular territory or any residual hypervascularity. Access removed. Assist and excessive device. No immediate complication. Patient tolerated the procedure well. IMPRESSION: Positive angiogram for active arterial bleeding from a small peripheral branch of the right colic artery into the right colon. This correlates with the CTA. Successful peripheral right colic artery micro coil embolization at the bleeding site as above. Electronically Signed   By: Judie Petit.  Shick M.D.   On: 11/14/2019 08:53   IR Angiogram Selective Each Additional Vessel  Result Date: 11/14/2019 INDICATION: Acute lower GI bleeding, positive CTA for right colon active arterial bleeding. EXAM: Ultrasound guidance for vascular access SMA catheterization and angiograms Ileocolic and right colic peripheral micro catheterization and angiograms Peripheral right colic micro catheterization, angiogram, and micro coil embolization for active bleeding. MEDICATIONS: 1% lidocaine  local. ANESTHESIA/SEDATION: Moderate (conscious) sedation was employed during this procedure. A total of Versed 2.0 mg and Fentanyl 50 mcg was administered intravenously. Moderate Sedation Time: 74 minutes. The patient's level of consciousness and vital signs were monitored continuously by radiology nursing throughout the procedure under my direct supervision. CONTRAST:  125 cc omni 300 FLUOROSCOPY TIME:  Fluoroscopy Time: 22 minutes 48 seconds (343 mGy).  COMPLICATIONS: None immediate. PROCEDURE: Informed consent was obtained from the patient following explanation of the procedure, risks, benefits and alternatives. The patient understands, agrees and consents for the procedure. All questions were addressed. A time out was performed prior to the initiation of the procedure. Maximal barrier sterile technique utilized including caps, mask, sterile gowns, sterile gloves, large sterile drape, hand hygiene, and Betadine prep. Under sterile conditions and local anesthesia, ultrasound micropuncture access performed of the patent right common femoral artery. Images obtained for documentation of the patent right common femoral artery. Five French sheath inserted over a guidewire. Initially, a C2 catheter over a Bentson guidewire was utilized to select the SMA. SMA angiogram performed. SMA: SMA is widely patent including the jejunal, colic and ileocolic branches. Suspicious hypervascularity in the right colon along the peripheral aspect of the right colic artery and marginal arteries. Repeat SMA angiogram performed with a power injection confirming active contrast extravasation/colonic bleeding from a small peripheral right colic branch in the right colon. This correlates with the CTA. The C2 catheter exchange for a chung 2.5 catheter. This was utilized to resect the SMA origin for better purchase. Additional SMA angiogram confirms active bleeding in the right colon from a small peripheral branch from the right colic artery. Through the [facility] 2.5 catheter, a Renegade STC catheter over a fathom guidewire was advanced into the main SMA trunk. Catheter was advanced into the ileocolic branch. Ileocolic angiogram performed. Ileocolic: Ileocolic and right colic branches are patent. There are hypervascular branches from the right iliac colic and marginal artery territory. Active contrast extravasation has temporarily stopped. Microcatheter was advanced into the right colic artery.  Branch of the right colic artery was catheterized. Tortuous hypervascularity noted within this vascular territory which correlates with the area of active bleeding by prior angiograms and CTA. For embolization, 2 2 mm microcoils were deployed within the peripheral right colic branch. Following this, follow-up peripheral right colic angiogram through the microcatheter demonstrates no further active bleeding or hypervascularity. This was repeated after approximately 5 minutes and there was no further active arterial bleeding in the right colon or hypervascularity demonstrated. Final repeat SMA angiogram again demonstrates no further active bleeding in the right colic vascular territory or any residual hypervascularity. Access removed. Assist and excessive device. No immediate complication. Patient tolerated the procedure well. IMPRESSION: Positive angiogram for active arterial bleeding from a small peripheral branch of the right colic artery into the right colon. This correlates with the CTA. Successful peripheral right colic artery micro coil embolization at the bleeding site as above. Electronically Signed   By: Judie Petit.  Shick M.D.   On: 11/14/2019 08:53   IR US Guide Vasc Access Right  Result Date: 11/14/2019 INDICATION: Acute lower GI bleeding, positive CTA for right colon active arterial bleeding. EXAM: Ultrasound guidance for vascular access SMA catheterization and angiograms Ileocolic and right colic peripheral micro catheterization and angiograms Peripheral right colic micro catheterization, angiogram, and micro coil embolization for active bleeding. MEDICATIONS: 1% lidocaine local. ANESTHESIA/SEDATION: Moderate (conscious) sedation was employed during this procedure. A total of Versed 2.0 mg and Fentanyl 50 mcg was administered  intravenously. Moderate Sedation Time: 74 minutes. The patient's level of consciousness and vital signs were monitored continuously by radiology nursing throughout the procedure under  my direct supervision. CONTRAST:  125 cc omni 300 FLUOROSCOPY TIME:  Fluoroscopy Time: 22 minutes 48 seconds (343 mGy). COMPLICATIONS: None immediate. PROCEDURE: Informed consent was obtained from the patient following explanation of the procedure, risks, benefits and alternatives. The patient understands, agrees and consents for the procedure. All questions were addressed. A time out was performed prior to the initiation of the procedure. Maximal barrier sterile technique utilized including caps, mask, sterile gowns, sterile gloves, large sterile drape, hand hygiene, and Betadine prep. Under sterile conditions and local anesthesia, ultrasound micropuncture access performed of the patent right common femoral artery. Images obtained for documentation of the patent right common femoral artery. Five French sheath inserted over a guidewire. Initially, a C2 catheter over a Bentson guidewire was utilized to select the SMA. SMA angiogram performed. SMA: SMA is widely patent including the jejunal, colic and ileocolic branches. Suspicious hypervascularity in the right colon along the peripheral aspect of the right colic artery and marginal arteries. Repeat SMA angiogram performed with a power injection confirming active contrast extravasation/colonic bleeding from a small peripheral right colic branch in the right colon. This correlates with the CTA. The C2 catheter exchange for a chung 2.5 catheter. This was utilized to resect the SMA origin for better purchase. Additional SMA angiogram confirms active bleeding in the right colon from a small peripheral branch from the right colic artery. [facility], a Renegade STC catheter over a fathom guidewire was advanced into the main SMA trunk. Catheter was advanced into the ileocolic branch. Ileocolic angiogram performed. Ileocolic: Ileocolic and right colic branches are patent. There are hypervascular branches from the right iliac colic and marginal artery  territory. Active contrast extravasation has temporarily stopped. Microcatheter was advanced into the right colic artery. Branch of the right colic artery was catheterized. Tortuous hypervascularity noted within this vascular territory which correlates with the area of active bleeding by prior angiograms and CTA. For embolization, 2 2 mm microcoils were deployed within the peripheral right colic branch. Following this, follow-up peripheral right colic angiogram through the microcatheter demonstrates no further active bleeding or hypervascularity. This was repeated after approximately 5 minutes and there was no further active arterial bleeding in the right colon or hypervascularity demonstrated. Final repeat SMA angiogram again demonstrates no further active bleeding in the right colic vascular territory or any residual hypervascularity. Access removed. Assist and excessive device. No immediate complication. Patient tolerated the procedure well. IMPRESSION: Positive angiogram for active arterial bleeding from a small peripheral branch of the right colic artery into the right colon. This correlates with the CTA. Successful peripheral right colic artery micro coil embolization at the bleeding site as above. Electronically Signed   By: Judie PetitM.  Shick M.D.   On: 11/14/2019 08:53   IR EMBO ART  VEN HEMORR LYMPH EXTRAV  INC GUIDE ROADMAPPING  Result Date: 11/14/2019 INDICATION: Acute lower GI bleeding, positive CTA for right colon active arterial bleeding. EXAM: Ultrasound guidance for vascular access SMA catheterization and angiograms Ileocolic and right colic peripheral micro catheterization and angiograms Peripheral right colic micro catheterization, angiogram, and micro coil embolization for active bleeding. MEDICATIONS: 1% lidocaine local. ANESTHESIA/SEDATION: Moderate (conscious) sedation was employed during this procedure. A total of Versed 2.0 mg and Fentanyl 50 mcg was administered intravenously. Moderate Sedation  Time: 74 minutes. The patient's level of consciousness and vital signs were  monitored continuously by radiology nursing throughout the procedure under my direct supervision. CONTRAST:  125 cc omni 300 FLUOROSCOPY TIME:  Fluoroscopy Time: 22 minutes 48 seconds (343 mGy). COMPLICATIONS: None immediate. PROCEDURE: Informed consent was obtained from the patient following explanation of the procedure, risks, benefits and alternatives. The patient understands, agrees and consents for the procedure. All questions were addressed. A time out was performed prior to the initiation of the procedure. Maximal barrier sterile technique utilized including caps, mask, sterile gowns, sterile gloves, large sterile drape, hand hygiene, and Betadine prep. Under sterile conditions and local anesthesia, ultrasound micropuncture access performed of the patent right common femoral artery. Images obtained for documentation of the patent right common femoral artery. Five French sheath inserted over a guidewire. Initially, a C2 catheter over a Bentson guidewire was utilized to select the SMA. SMA angiogram performed. SMA: SMA is widely patent including the jejunal, colic and ileocolic branches. Suspicious hypervascularity in the right colon along the peripheral aspect of the right colic artery and marginal arteries. Repeat SMA angiogram performed with a power injection confirming active contrast extravasation/colonic bleeding from a small peripheral right colic branch in the right colon. This correlates with the CTA. The C2 catheter exchange for a chung 2.5 catheter. This was utilized to resect the SMA origin for better purchase. Additional SMA angiogram confirms active bleeding in the right colon from a small peripheral branch from the right colic artery. Through the [facility] 2.5 catheter, a Renegade STC catheter over a fathom guidewire was advanced into the main SMA trunk. Catheter was advanced into the ileocolic branch. Ileocolic angiogram  performed. Ileocolic: Ileocolic and right colic branches are patent. There are hypervascular branches from the right iliac colic and marginal artery territory. Active contrast extravasation has temporarily stopped. Microcatheter was advanced into the right colic artery. Branch of the right colic artery was catheterized. Tortuous hypervascularity noted within this vascular territory which correlates with the area of active bleeding by prior angiograms and CTA. For embolization, 2 2 mm microcoils were deployed within the peripheral right colic branch. Following this, follow-up peripheral right colic angiogram through the microcatheter demonstrates no further active bleeding or hypervascularity. This was repeated after approximately 5 minutes and there was no further active arterial bleeding in the right colon or hypervascularity demonstrated. Final repeat SMA angiogram again demonstrates no further active bleeding in the right colic vascular territory or any residual hypervascularity. Access removed. Assist and excessive device. No immediate complication. Patient tolerated the procedure well. IMPRESSION: Positive angiogram for active arterial bleeding from a small peripheral branch of the right colic artery into the right colon. This correlates with the CTA. Successful peripheral right colic artery micro coil embolization at the bleeding site as above. Electronically Signed   By: Judie Petit.  Shick M.D.   On: 11/14/2019 08:53   CT Angio Abd/Pel W and/or Wo Contrast  Result Date: 11/13/2019 CLINICAL DATA:  Significant bloody stools this morning. Evaluate for source of GI bleeding. EXAM: CTA ABDOMEN AND PELVIS WITHOUT AND WITH CONTRAST TECHNIQUE: Multidetector CT imaging of the abdomen and pelvis was performed using the standard protocol during bolus administration of intravenous contrast. Multiplanar reconstructed images and MIPs were obtained and reviewed to evaluate the vascular anatomy. CONTRAST:  OMNIPAQUE IOHEXOL  350 MG/ML SOLN COMPARISON:  None. FINDINGS: VASCULAR Aorta: Normal caliber of the abdominal aorta. No evidence of abdominal aortic dissection or periaortic stranding. Celiac: Widely patent without a hemodynamically significant narrowing. Mild ectasia of the splenic artery at the level of the  hilum measuring approximately 0.8 cm in diameter (coronal image 69, series 5), of doubtful clinical concern. SMA: Widely patent without a hemodynamically significant narrowing. The distal tributaries of the SMA appear widely patent without discrete intraluminal filling defect to suggest distal embolism. Pooling intraluminal contrast extravasation is seen within the mid ascending colon via a tertiary branch/arcade of the right colic artery. Renals: Solitary bilaterally; widely patent without hemodynamically significant narrowing. No vessel irregularity to suggest FMD. IMA: Widely patent without a hemodynamically significant narrowing. Inflow: The bilateral common, external and internal iliac arteries are of normal caliber and widely patent without hemodynamically significant narrowing. Proximal Outflow: The bilateral common and imaged portions of the bilateral deep and superficial femoral arteries are widely patent without hemodynamically significant narrowing. Veins: The IVC and pelvic venous systems appear widely patent. Note is made of a retroaortic left renal vein. Review of the MIP images confirms the above findings. _________________________________________________________ NON-VASCULAR Lower chest: Limited visualization of the lower thorax demonstrates minimal dependent subpleural ground-glass atelectasis. No discrete focal airspace opacities. No pleural effusion Normal heart size.  No pericardial effusion. Hepatobiliary: Normal hepatic contour. 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 Normal appearance of the gallbladder. No radiopaque gallstones. No intra or extrahepatic biliary ductal dilatation. No ascites. Pancreas: Normal appearance of the pancreas. Spleen: Normal appearance of the spleen. Note is made of a small splenule about the anterior tip of the spleen. Adrenals/Urinary Tract: Review of the precontrast images demonstrate excretion of contrast within the bilateral renal collecting systems. No definite renal stones. No discrete renal lesions. No urine obstruction or perinephric stranding. Normal appearance the bilateral adrenal glands. Normal appearance of the urinary bladder given degree of distention. Stomach/Bowel: There is pooling of intraluminal contrast within the mid aspect of the ascending colon (axial image 67, series 6; image 41, series 12; coronal image 44, series 6), compatible with an area of active lower GI bleeding. Rather extensive colonic diverticulosis is seen throughout the colon, including at the location of the active GI bleeding. No discrete colonic mass or evidence of enteric obstruction. Normal appearance of the terminal ileum and the retrocecal appendix. No pneumoperitoneum, pneumatosis or portal venous gas. Lymphatic: While there is no bulky retroperitoneal, mesenteric, pelvic or inguinal lymphadenopathy, note is made of an approximately 2.3 x 1.9 x 1.9 cm well-defined nonenhancing nodule within the midline of the upper abdominal mesentery (image 52, series 6; image 31, series 12; coronal image 28, series 8). Reproductive: Mildly enlarged but otherwise normal-appearing uterus. No discrete adnexal lesion. No free fluid in the pelvic cul-de-sac. Other: Regional soft tissues appear normal. Musculoskeletal: No acute or aggressive osseous abnormalities. Mild-to-moderate multilevel lumbar spine DDD, worse at L4-L5 and to a lesser extent, L2-L3 with disc space height loss, endplate irregularity and sclerosis. Post right total hip replacement  with age-indeterminate ill-defined lucency surrounding the acetabular component. 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 IMPRESSION: 1. 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 2. Normal caliber of the abdominal aorta without significant atherosclerotic plaque. 3. 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 Comparison with prior outside examinations (  if available), is advised. Otherwise, follow-up multiphase CT scan in 3 months could be performed for further evaluation as indicated. 4. Post right total hip replacement with age-indeterminate ill-defined lucency surrounding the acetabular component, nonspecific though could be seen in the setting hardware loosening. Clinical correlation is advised. 5. Mild to moderate multilevel lumbar spine DDD. 6. 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 Critical Value/emergent results were called by telephone at the time of interpretation on 11/13/2019 at 4:17 pm to provider Dr. Myrtie Neither, who verbally acknowledged these results. Electronically Signed   By: Simonne Come M.D.   On: 11/13/2019 16:42     TODAY-DAY OF DISCHARGE:  Subjective:   Margarie Mcguirt today has no headache,no chest abdominal pain,no new weakness tingling or numbness, feels much better wants to go home today.   Objective:   Blood pressure 121/60, pulse 76, temperature 97.8 F (36.6 C), resp. rate 18, height  (1.6 m), weight 63.3 kg, SpO2 98 %.  Intake/Output Summary (Last 24 hours) at 11/16/2019 1012 Last data filed at 11/15/2019 1900 Gross per 24 hour  Intake 799.67 ml  Output --  Net 799.67 ml   Filed Weights    11/13/19 1014 11/13/19 2131 11/15/19 0351  Weight: 62.6 kg 63.1 kg 63.3 kg    Exam: Awake Alert, Oriented *3, No new F.N deficits, Normal affect [place].AT,PERRAL Supple Neck,No JVD, No cervical lymphadenopathy appriciated.  Symmetrical Chest wall movement, Good air movement bilaterally, CTAB RRR,No Gallops,Rubs or new Murmurs, No Parasternal Heave +ve B.Sounds, Abd Soft, Non tender, No organomegaly appriciated, No rebound -guarding or rigidity. No Cyanosis, Clubbing or edema, No new Rash or bruise   PERTINENT RADIOLOGIC STUDIES: No results found.   PERTINENT LAB RESULTS: CBC: Recent Labs    11/15/19 0355 11/15/19 1208 11/15/19 2041 11/16/19 0537  WBC 8.1  --   --  7.0  HGB 7.1*   < > 8.6* 9.0*  HCT 21.9*   < > 25.9* 27.3*  PLT 139*  --   --  184   < > = values in this interval not displayed.   CMET CMP     Component Value Date/Time   NA 142 11/16/2019 0537   K 4.0 11/16/2019 0537   CL 108 11/16/2019 0537   CO2 27 11/16/2019 0537   GLUCOSE 92 11/16/2019 0537   BUN 10 11/16/2019 0537   CREATININE 0.54 11/16/2019 0537   CALCIUM 8.1 (L) 11/16/2019 0537   PROT 5.4 (L) 11/13/2019 1015   ALBUMIN 2.6 (L) 11/13/2019 1015   AST 16 11/13/2019 1015   ALT 13 11/13/2019 1015   ALKPHOS 74 11/13/2019 1015   BILITOT 0.6 11/13/2019 1015   GFRNONAA >60 11/16/2019 0537   GFRAA >60 11/16/2019 0537    GFR Estimated Creatinine Clearance: 65.4 mL/min (by C-G formula based on SCr of 0.54 mg/dL). No results for input(s): LIPASE, AMYLASE in the last 72 hours. No results for input(s): CKTOTAL, CKMB, CKMBINDEX, TROPONINI in the last 72 hours. Invalid input(s): POCBNP No results for input(s): DDIMER in the last 72 hours. No results for input(s): HGBA1C in the last 72 hours. No results for input(s): CHOL, HDL, LDLCALC, TRIG, CHOLHDL, LDLDIRECT in the last 72 hours. No results for input(s): TSH, T4TOTAL, T3FREE, THYROIDAB in the last 72 hours.  Invalid input(s): FREET3 No results for  input(s): VITAMINB12, FOLATE, FERRITIN, TIBC, IRON, RETICCTPCT in the last 72 hours. Coags: Recent Labs    11/13/19 1015  INR 1.1   Microbiology: Recent Results (from the past 240  hour(s))  SARS Coronavirus 2 by RT PCR (hospital order, performed in [facility] hospital lab) Nasopharyngeal Nasopharyngeal Swab     Status: None   Collection Time: 11/13/19 10:33 AM   Specimen: Nasopharyngeal Swab  Result Value Ref Range Status   SARS Coronavirus 2 NEGATIVE NEGATIVE Final    Comment: (NOTE) SARS-CoV-2 target nucleic acids are NOT DETECTED. The SARS-CoV-2 RNA is generally detectable in upper and lower respiratory specimens during the acute phase of infection. The lowest concentration of SARS-CoV-2 viral copies this assay can detect is 250 copies / mL. A negative result does not preclude SARS-CoV-2 infection and should not be used as the sole basis for treatment or other patient management decisions.  A negative result may occur with improper specimen collection / handling, submission of specimen other than nasopharyngeal swab, presence of viral mutation(s) within the areas targeted by this assay, and inadequate number of viral copies (<250 copies / mL). A negative result must be combined with clinical observations, patient history, and epidemiological information. Fact Sheet for Patients:   StrictlyIdeas.no Fact Sheet for Healthcare Providers: BankingDealers.co.za This test is not yet approved or cleared  by the [place] FDA and has been authorized for detection and/or diagnosis of SARS-CoV-2 by FDA under an Emergency Use Authorization (EUA).  This EUA will remain in effect (meaning this test can be used) for the duration of the COVID-19 declaration under Section 564(b)(1) of the Act, 21 U.S.C. section 360bbb-3(b)(1), unless the authorization is terminated or revoked sooner. Performed at [facility], [place] [address],  [place], [place] [address]     FURTHER DISCHARGE INSTRUCTIONS:  Get Medicines reviewed and adjusted: Please take all your medications with you for your next visit with your Primary MD  Laboratory/radiological data: Please request your Primary MD to go over all hospital tests and procedure/radiological results at the follow up, please ask your Primary MD to get all Hospital records sent to his/her office.  In some cases, they will be blood work, cultures and biopsy results pending at the time of your discharge. Please request that your primary care M.D. goes through all the records of your hospital data and follows up on these results.  Also Note the following: If you experience worsening of your admission symptoms, develop shortness of breath, life threatening emergency, suicidal or homicidal thoughts you must seek medical attention immediately by calling 911 or calling your MD immediately  if symptoms less severe.  You must read complete instructions/literature along with all the possible adverse reactions/side effects for all the Medicines you take and that have been prescribed to you. Take any new Medicines after you have completely understood and accpet all the possible adverse reactions/side effects.   Do not drive when taking Pain medications or sleeping medications (Benzodaizepines)  Do not take more than prescribed Pain, Sleep and Anxiety Medications. It is not advisable to combine anxiety,sleep and pain medications without talking with your primary care practitioner  Special Instructions: If you have smoked or chewed Tobacco  in the last 2 yrs please stop smoking, stop any regular Alcohol  and or any Recreational drug use.  Wear Seat belts while driving.  Please note: You were cared for by a hospitalist during your hospital stay. Once you are discharged, your primary care physician will handle any further medical issues. Please note that NO REFILLS for any discharge medications will  be authorized once you are discharged, as it is imperative that you return to your primary care physician (or  establish a relationship with a primary care physician if you do not have one) for your post hospital discharge needs so that they can reassess your need for medications and monitor your lab values.  Total Time spent coordinating discharge including counseling, education and face to face time equals 35 minutes.  SignedJeoffrey Massed: Malayzia Laforte 11/16/2019 10:12 AM

## 2019-11-20 ENCOUNTER — Other Ambulatory Visit: Payer: Self-pay | Admitting: Family Medicine

## 2019-11-20 DIAGNOSIS — R935 Abnormal findings on diagnostic imaging of other abdominal regions, including retroperitoneum: Secondary | ICD-10-CM

## 2019-11-27 ENCOUNTER — Encounter: Payer: Self-pay | Admitting: Gastroenterology

## 2020-01-03 ENCOUNTER — Ambulatory Visit: Payer: No Typology Code available for payment source | Admitting: Gastroenterology

## 2020-01-03 ENCOUNTER — Encounter: Payer: Self-pay | Admitting: Gastroenterology

## 2020-01-03 VITALS — BP 138/68 | HR 81 | Ht 63.0 in | Wt 143.4 lb

## 2020-01-03 DIAGNOSIS — R933 Abnormal findings on diagnostic imaging of other parts of digestive tract: Secondary | ICD-10-CM | POA: Diagnosis not present

## 2020-01-03 DIAGNOSIS — K5731 Diverticulosis of large intestine without perforation or abscess with bleeding: Secondary | ICD-10-CM

## 2020-01-03 DIAGNOSIS — D62 Acute posthemorrhagic anemia: Secondary | ICD-10-CM

## 2020-01-03 NOTE — Patient Instructions (Signed)
If you are age 63 or older, your body mass index should be between 23-30. Your Body mass index is 25.4 kg/m. If this is out of the aforementioned range listed, please consider follow up with your Primary Care Provider.  If you are age 37 or younger, your body mass index should be between 19-25. Your Body mass index is 25.4 kg/m. If this is out of the aformentioned range listed, please consider follow up with your Primary Care Provider.   You will be due for a recall colonoscopy in 03-2027. We will send you a reminder in the mail when it gets closer to that time.  Follow up as needed.   It was a pleasure to see you today!  Dr. Myrtie Neither

## 2020-01-03 NOTE — Progress Notes (Addendum)
[facility] Consult Note:  History: Lindsey Stone 01/03/2020  Referring provider: Sigmund Hazel, MD  Reason for consult/chief complaint: Diverticulitis (She was in hospital with diverticulitis this is just a follow up appointment, she states she is not having any roblems today. Last colonoscopy was by [facility] in 2018)   Subjective  HPI: Colonoscopy [facility] Sept 2018 for rectal bleeding  - cannot access report. Colorectal surgery consult from 03/26/2017 by Dr. Drue Dun indication patient underwent colonoscopy on 03/09/2017, some anal abnormalities were discovered on scope insertion.  Dr. Joycelyn Das exam revealed multiple frond-like anal lesions that were felt to be residual hemorrhoidal skin tags, less likely condyloma.  They were removed at Dr. Joycelyn Das office on 05/11/2017.  Lindsey Stone is known to me from a inpatient consultation 11/13/2019 where she presented with large volume lower GI bleeding and hypotension. Stat CT angiogram revealed extensive colonic diverticulosis and extravasation in the ascending colon.  Interventional radiology performed coil embolization which controlled the bleeding and the patient was discharged within a couple of days.   Lindsey Stone reports feeling well since the hospital discharge.  She saw primary care afterwards and had a CBC checked, says her hemoglobin was improving but not yet normal.  They had prescribed some iron but she stopped taking it due to digestive upset.  She denies abdominal pain, recurrence of lower GI bleeding, chest pain, dyspnea or fatigue.   ROS:  Review of Systems  Constitutional: Negative for appetite change and unexpected weight change.  HENT: Negative for mouth sores and voice change.   Eyes: Negative for pain and redness.  Respiratory: Negative for cough and shortness of breath.   Cardiovascular: Negative for chest pain and palpitations.  Genitourinary: Negative for dysuria and hematuria.  Musculoskeletal: Negative for  arthralgias and myalgias.  Skin: Negative for pallor and rash.  Neurological: Negative for weakness and headaches.  Hematological: Negative for adenopathy.     Past Medical History: Past Medical History:  Diagnosis Date  . Allergic rhinitis   . Anal skin tag   . Arthritis   . Blepharitis   . Dermatitis, seborrheic   . Diverticulosis   . Hair loss   . Hypertension   . Osteopenia   . Primary osteoarthritis of right hip   . Vitamin D deficiency      Past Surgical History: Past Surgical History:  Procedure Laterality Date  . BREAST BIOPSY    . BUNIONECTOMY Right   . HEMORRHOID BANDING     [facility]  . HEMORRHOID SURGERY    . IR ANGIOGRAM SELECTIVE EACH ADDITIONAL VESSEL  11/13/2019  . IR ANGIOGRAM VISCERAL SELECTIVE  11/13/2019  . IR EMBO ART  VEN HEMORR LYMPH EXTRAV  INC GUIDE ROADMAPPING  11/13/2019  . IR US GUIDE VASC ACCESS RIGHT  11/13/2019  . right hand surgery    . Right hip replaced       Family History: Family History  Problem Relation Age of Onset  . Hypertension Mother   . Hyperlipidemia Mother   . Hypertension Father   . Hyperlipidemia Father   . Deep vein thrombosis Father   . Heart disease Father        Required pacemaker    Social History: Social History   Socioeconomic History  . Marital status: Married    Spouse name: Not on file  . Number of children: Not on file  . Years of education: Not on file  . Highest education level: Not on file  Occupational History  . Not on  file  Tobacco Use  . Smoking status: Former Games developer  . Smokeless tobacco: Never Used  . Tobacco comment: quit in 2003  Vaping Use  . Vaping Use: Never used  Substance and Sexual Activity  . Alcohol use: Yes    Alcohol/week: 1.0 standard drink    Types: 1 Glasses of wine per week  . Drug use: Never  . Sexual activity: Yes  Other Topics Concern  . Not on file  Social History Narrative  . Not on file   Social Determinants of Health   Financial Resource Strain:   .  Difficulty of Paying Living Expenses:   Food Insecurity:   . Worried About Programme researcher, broadcasting/film/video in the Last Year:   . Barista in the Last Year:   Transportation Needs:   . Freight forwarder (Medical):   Marland Kitchen Lack of Transportation (Non-Medical):   Physical Activity:   . Days of Exercise per Week:   . Minutes of Exercise per Session:   Stress:   . Feeling of Stress :   Social Connections:   . Frequency of Communication with Friends and Family:   . Frequency of Social Gatherings with Friends and Family:   . Attends Religious Services:   . Active Member of Clubs or Organizations:   . Attends Banker Meetings:   Marland Kitchen Marital Status:     Allergies: No Known Allergies  Outpatient Meds: Current Outpatient Medications  Medication Sig Dispense Refill  . estradiol (ESTRACE) 2 MG tablet Take 2 mg by mouth daily.    . hydrochlorothiazide (HYDRODIURIL) 12.5 MG tablet Take 12.5 mg by mouth daily.    Marland Kitchen OVER THE COUNTER MEDICATION Take 1 tablet by mouth daily as needed. OTC allergy medicine    . progesterone (PROMETRIUM) 100 MG capsule Take 100 mg by mouth See admin instructions. Take 1 capsule (100mg ) by mouth for 10 days. Every 2 months     No current facility-administered medications for this visit.      ___________________________________________________________________ Objective   Exam:  BP 138/68 (BP Location: Left Arm)   Pulse 81   Ht 5\' 3"  (1.6 m)   Wt 143 lb 6.4 oz (65 kg)   SpO2 98%   BMI 25.40 kg/m    General: Well-appearing  Eyes: sclera anicteric, no redness  ENT: oral mucosa moist without lesions, no cervical or supraclavicular lymphadenopathy  CV: RRR without murmur, S1/S2, no JVD, no peripheral edema  Resp: clear to auscultation bilaterally, normal RR and effort noted  GI: soft, no tenderness, with active bowel sounds. No guarding or palpable organomegaly noted.  Skin; warm and dry, no rash or jaundice noted  Neuro: awake, alert and  oriented x 3. Normal gross motor function and fluent speech   Radiologic Studies:  CLINICAL DATA:  Significant bloody stools this morning. Evaluate for source of GI bleeding.   EXAM: CTA ABDOMEN AND PELVIS WITHOUT AND WITH CONTRAST   TECHNIQUE: Multidetector CT imaging of the abdomen and pelvis was performed using the standard protocol during bolus administration of intravenous contrast. Multiplanar reconstructed images and MIPs were obtained and reviewed to evaluate the vascular anatomy.   CONTRAST:  OMNIPAQUE IOHEXOL 350 MG/ML SOLN   COMPARISON:  None.   FINDINGS: VASCULAR   Aorta: Normal caliber of the abdominal aorta. No evidence of abdominal aortic dissection or periaortic stranding.   Celiac: Widely patent without a hemodynamically significant narrowing. Mild ectasia of the splenic artery at the level of the hilum  measuring approximately 0.8 cm in diameter (coronal image 69, series 5), of doubtful clinical concern.   SMA: Widely patent without a hemodynamically significant narrowing. The distal tributaries of the SMA appear widely patent without discrete intraluminal filling defect to suggest distal embolism.   Pooling intraluminal contrast extravasation is seen within the mid ascending colon via a tertiary branch/arcade of the right colic artery.   Renals: Solitary bilaterally; widely patent without hemodynamically significant narrowing. No vessel irregularity to suggest FMD.   IMA: Widely patent without a hemodynamically significant narrowing.   Inflow: The bilateral common, external and internal iliac arteries are of normal caliber and widely patent without hemodynamically significant narrowing.   Proximal Outflow: The bilateral common and imaged portions of the bilateral deep and superficial femoral arteries are widely patent without hemodynamically significant narrowing.   Veins: The IVC and pelvic venous systems appear widely patent. Note is  made of a retroaortic left renal vein.   Review of the MIP images confirms the above findings.   _________________________________________________________   NON-VASCULAR   Lower chest: Limited visualization of the lower thorax demonstrates minimal dependent subpleural ground-glass atelectasis. No discrete focal airspace opacities. No pleural effusion   Normal heart size.  No pericardial effusion.   Hepatobiliary: Normal hepatic contour. 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 Normal appearance of the gallbladder. No radiopaque gallstones. No intra or extrahepatic biliary ductal dilatation. No ascites.   Pancreas: Normal appearance of the pancreas.   Spleen: Normal appearance of the spleen. Note is made of a small splenule about the anterior tip of the spleen.   Adrenals/Urinary Tract: Review of the precontrast images demonstrate excretion of contrast within the bilateral renal collecting systems. No definite renal stones. No discrete renal lesions. No urine obstruction or perinephric stranding. Normal appearance the bilateral adrenal glands. Normal appearance of the urinary bladder given degree of distention.   Stomach/Bowel: There is pooling of intraluminal contrast within the mid aspect of the ascending colon (axial image 67, series 6; image 41, series 12; coronal image 44, series 6), compatible with an area of active lower GI bleeding. Rather extensive colonic diverticulosis is seen throughout the colon, including at the location of the active GI bleeding. No discrete colonic mass or evidence of enteric obstruction. Normal appearance of the terminal ileum and the retrocecal appendix. No pneumoperitoneum, pneumatosis or portal venous gas.   Lymphatic: While there is no bulky retroperitoneal,  mesenteric, pelvic or inguinal lymphadenopathy, note is made of an approximately 2.3 x 1.9 x 1.9 cm well-defined nonenhancing nodule within the midline of the upper abdominal mesentery (image 52, series 6; image 31, series 12; coronal image 28, series 8).   Reproductive: Mildly enlarged but otherwise normal-appearing uterus. No discrete adnexal lesion. No free fluid in the pelvic cul-de-sac.   Other: Regional soft tissues appear normal.   Musculoskeletal: No acute or aggressive osseous abnormalities. Mild-to-moderate multilevel lumbar spine DDD, worse at L4-L5 and to a lesser extent, L2-L3 with disc space height loss, endplate irregularity and sclerosis.   Post right total hip replacement with age-indeterminate ill-defined lucency surrounding the acetabular component. 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   IMPRESSION: 1. Examination is positive for active lower GI bleeding involving the mid aspect of the ascending colon supplied via a tertiary branch/arcade of right colic artery  likely secondary diverticular disease. 2. Normal caliber of the abdominal aorta without significant atherosclerotic plaque. 3. 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 Comparison with prior outside examinations (if available), is advised. Otherwise, follow-up multiphase CT scan in 3 months could be performed for further evaluation as indicated. 4. Post right total hip replacement with age-indeterminate ill-defined lucency surrounding the acetabular component, nonspecific though could be seen in the setting hardware loosening. Clinical correlation is advised. 5. Mild to moderate multilevel lumbar spine DDD. 6. 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   Critical  Value/emergent results were called by telephone at the time of interpretation on 11/13/2019 at 4:17 pm to provider Dr. Myrtie Neitheranis, who verbally acknowledged these results.     Electronically Signed   By: Simonne ComeJohn  Watts M.D.   On: 11/13/2019 16:42 INDICATION: Acute lower GI bleeding, positive CTA for right colon active arterial bleeding.   EXAM: Ultrasound guidance for vascular access   SMA catheterization and angiograms   Ileocolic and right colic peripheral micro catheterization and angiograms   Peripheral right colic micro catheterization, angiogram, and micro coil embolization for active bleeding.   MEDICATIONS: 1% lidocaine local.   ANESTHESIA/SEDATION: Moderate (conscious) sedation was employed during this procedure. A total of Versed 2.0 mg and Fentanyl 50 mcg was administered intravenously.   Moderate Sedation Time: 74 minutes. The patient's level of consciousness and vital signs were monitored continuously by radiology nursing throughout the procedure under my direct supervision.   CONTRAST:  125 cc omni 300   FLUOROSCOPY TIME:  Fluoroscopy Time: 22 minutes 48 seconds (343 mGy).   COMPLICATIONS: None immediate.   PROCEDURE: Informed consent was obtained from the patient following explanation of the procedure, risks, benefits and alternatives. The patient understands, agrees and consents for the procedure. All questions were addressed. A time out was performed prior to the initiation of the procedure. Maximal barrier sterile technique utilized including caps, mask, sterile gowns, sterile gloves, large sterile drape, hand hygiene, and Betadine prep.   Under sterile conditions and local anesthesia, ultrasound micropuncture access performed of the patent right common femoral artery. Images obtained for documentation of the patent right common femoral artery. Five French sheath inserted over a guidewire. Initially, a C2 catheter over a Bentson guidewire was utilized  to select the SMA. SMA angiogram performed.   SMA: SMA is widely patent including the jejunal, colic and ileocolic branches. Suspicious hypervascularity in the right colon along the peripheral aspect of the right colic artery and marginal arteries.   Repeat SMA angiogram performed with a power injection confirming active contrast extravasation/colonic bleeding from a small peripheral right colic branch in the right colon. This correlates with the CTA.   The C2 catheter exchange for a chung 2.5 catheter. This was utilized to resect the SMA origin for better purchase. Additional SMA angiogram confirms active bleeding in the right colon from a small peripheral branch from the right colic artery.   [facility], a Renegade STC catheter over a fathom guidewire was advanced into the main SMA trunk. Catheter was advanced into the ileocolic branch. Ileocolic angiogram performed.   Ileocolic: Ileocolic and right colic branches are patent. There are hypervascular branches from the right iliac colic and marginal artery territory. Active contrast extravasation has temporarily stopped.   Microcatheter was advanced into the right colic artery. Branch of the right colic artery was catheterized. Tortuous hypervascularity noted within  this vascular territory which correlates with the area of active bleeding by prior angiograms and CTA.   For embolization, 2 2 mm microcoils were deployed within the peripheral right colic branch. Following this, follow-up peripheral right colic angiogram through the microcatheter demonstrates no further active bleeding or hypervascularity. This was repeated after approximately 5 minutes and there was no further active arterial bleeding in the right colon or hypervascularity demonstrated.   Final repeat SMA angiogram again demonstrates no further active bleeding in the right colic vascular territory or any residual hypervascularity.   Access  removed. Assist and excessive device. No immediate complication. Patient tolerated the procedure well.   IMPRESSION: Positive angiogram for active arterial bleeding from a small peripheral branch of the right colic artery into the right colon. This correlates with the CTA. Successful peripheral right colic artery micro coil embolization at the bleeding site as above.     Electronically Signed   By: Judie Petit.  Shick M.D.   On: 11/14/2019 08:53     Assessment: Encounter Diagnoses  Name Primary?  . Diverticulosis of colon with hemorrhage Yes  . Acute blood loss anemia   . Abnormal finding on GI tract imaging     Large volume lower GI bleeding from diverticular source, treated with interventional radiology coil embolization in May.  It was her first episode of bleeding and she has not had any recurrence since then. Acute blood loss anemia, reportedly recovering.  Lindsey Stone tells me she has blood work scheduled with primary care in 2 to 3 weeks.  I recommended that if her hemoglobin has not yet normalized, that she take a low-dose iron call blood builder for 4 to 6 weeks afterwards.  Probable benign incidental finding on CT angiogram.  Recommendations were for a repeat CT scan in a few months.  Lindsey Stone tells me she has an appointment in early August for CT abdomen, and she was told to pick up oral contrast for that as well.  She believes it is with the [facility] radiology practice, so I have asked her to check on that to be sure.  If not, we will reschedule it with [facility] radiology so the images can be optimally compared to the prior scan. Plan:  Neala believes that her primary care provider has Dr. Jennye Boroughs full colonoscopy report from 2018, and she will make a request to have that sent to [place].  She recalls that no polyps were found, so I will place a colonoscopy recall for 2028. Lindsey Stone will otherwise come to see me as needed.   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

## 2020-01-10 ENCOUNTER — Telehealth: Payer: Self-pay | Admitting: Gastroenterology

## 2020-01-10 NOTE — Telephone Encounter (Signed)
Dr. Myrtie Neither   We received a referral for pt Hosp f/u diverticular bleed.  She saw you in the hospital.  Would to transfer care to you. We have received last colonoscopy from [facility] for your review. Will you accept this pt?

## 2020-01-10 NOTE — Telephone Encounter (Signed)
I already saw this patient in clinic June 30th.  The colonoscopy report from Dr Kinnie Scales was just being sent to complete her records with me, thanks.  - HD

## 2020-01-31 ENCOUNTER — Other Ambulatory Visit: Payer: Self-pay | Admitting: Family Medicine

## 2020-02-05 ENCOUNTER — Other Ambulatory Visit: Payer: No Typology Code available for payment source

## 2020-02-08 ENCOUNTER — Other Ambulatory Visit: Payer: Self-pay | Admitting: Family Medicine

## 2020-02-16 ENCOUNTER — Other Ambulatory Visit: Payer: No Typology Code available for payment source

## 2020-02-23 ENCOUNTER — Ambulatory Visit
Admission: RE | Admit: 2020-02-23 | Discharge: 2020-02-23 | Disposition: A | Discharge status: Home / Self Care | Payer: No Typology Code available for payment source | Source: Ambulatory Visit | Attending: Family Medicine | Admitting: Family Medicine

## 2020-02-23 DIAGNOSIS — R935 Abnormal findings on diagnostic imaging of other abdominal regions, including retroperitoneum: Secondary | ICD-10-CM

## 2020-02-23 MED ORDER — IOPAMIDOL (ISOVUE-300) INJECTION 61%
100.0000 mL | Freq: Once | INTRAVENOUS | Status: AC | PRN
  Administered 2020-02-23: 100 mL via INTRAVENOUS

## 2020-03-07 ENCOUNTER — Telehealth: Payer: Self-pay | Admitting: Gastroenterology

## 2020-03-07 NOTE — Telephone Encounter (Signed)
(  Saw her in clinic after hospital stay for GI bleeding.  She had liver cysts seen on initial hospital CT scan - somebody ? PCP was planning repeat CT to check on the liver findings)  Please let her know that whoever ordered the CT forwarded the report to me.  Small stable benign liver cysts. Reassure her that these cysts do not need to be followed with any more scans.  - HD

## 2020-03-08 NOTE — Telephone Encounter (Signed)
Detailed message left on patient's voicemail.

## 2020-10-30 ENCOUNTER — Encounter: Payer: Self-pay | Admitting: *Deleted

## 2020-11-04 ENCOUNTER — Encounter: Payer: Self-pay | Admitting: Diagnostic Neuroimaging

## 2020-11-04 ENCOUNTER — Ambulatory Visit (INDEPENDENT_AMBULATORY_CARE_PROVIDER_SITE_OTHER): Payer: No Typology Code available for payment source | Admitting: Diagnostic Neuroimaging

## 2020-11-04 ENCOUNTER — Other Ambulatory Visit: Payer: Self-pay

## 2020-11-04 VITALS — BP 145/81 | HR 72 | Ht 63.0 in | Wt 138.1 lb

## 2020-11-04 DIAGNOSIS — R42 Dizziness and giddiness: Secondary | ICD-10-CM

## 2020-11-04 NOTE — Progress Notes (Signed)
[facility]  PATIENT: Lindsey Stone DOB: March 19, 1956  REFERRING CLINICIAN: Sigmund Hazel, MD HISTORY FROM: patient  REASON FOR VISIT: new consult    HISTORICAL  CHIEF COMPLAINT:  Chief Complaint  Patient presents with  . New Patient (Initial Visit)    Rm 6 alone Here to discuss worsening vision- describes sx as blurry vision 1 time per year- when these events come on she does feel dizzy. Last time was in Feb of 2022, has seen the opthalmology- reported as normal. Also reports some soreness in the base in her skull Also reports numbness/tingling in bilateral feet over the last 1-2 months. Has not tried any meds for this.     HISTORY OF PRESENT ILLNESS:   65 year old female here for evaluation of abnormal spells.  For past 5 years she has had transient 1 minute episodes of lightheadedness, dizziness, balance off, blurred vision.  Her blurred vision is difficult to describe and she feels like she sees a uniform scrambled visual appearance.  She is unable to make out colors shapes or contours.  She usually closes her eyes during the episodes and waits for it to resolve.  Sometimes she has some spinning sensation.  No nausea or vomiting.  She denies any double vision or overlapping images.  Sometimes she feels like her eyes are being pulled together.  No specific triggering or aggravating factors.  These can occur in the seated or standing position.  This is occurred when she is at the store, at home or driving.  Likely she has some warning signs before episodes and she is able to get herself to a safe position.  After the episodes resolved she is usually able to stand up and return to her normal activities.  Her last episode occurred in February 2022.     REVIEW OF SYSTEMS: Full 14 system review of systems performed and negative with exception of: as per HPI.  ALLERGIES: No Known Allergies  HOME MEDICATIONS: Outpatient Medications Prior to Visit  Medication Sig  Dispense Refill  . estradiol (ESTRACE) 2 MG tablet Take 2 mg by mouth daily.    . hydrochlorothiazide (HYDRODIURIL) 12.5 MG tablet Take 12.5 mg by mouth daily.    Marland Kitchen OVER THE COUNTER MEDICATION Take 1 tablet by mouth daily as needed. OTC allergy medicine    . progesterone (PROMETRIUM) 100 MG capsule Take 100 mg by mouth See admin instructions. Take 1 capsule (100mg ) by mouth for 10 days. Every 2 months     No facility-administered medications prior to visit.    PAST MEDICAL HISTORY: Past Medical History:  Diagnosis Date  . Allergic rhinitis   . Anal skin tag   . Arthritis   . Atrial fibrillation (HCC)   . Blepharitis   . Dermatitis, seborrheic   . Diverticulosis   . Hair loss   . Hypertension   . Osteopenia   . Primary osteoarthritis of right hip   . Vitamin D deficiency     PAST SURGICAL HISTORY: Past Surgical History:  Procedure Laterality Date  . BREAST BIOPSY Right 1983  . BUNIONECTOMY Right 2010  . HEMORRHOID BANDING  11/2011   medoff  . HEMORRHOID SURGERY  2012  . IR ANGIOGRAM SELECTIVE EACH ADDITIONAL VESSEL  11/13/2019  . IR ANGIOGRAM VISCERAL SELECTIVE  11/13/2019  . IR EMBO ART  VEN HEMORR LYMPH EXTRAV  INC GUIDE ROADMAPPING  11/13/2019  . IR 01/13/2020 GUIDE VASC ACCESS RIGHT  11/13/2019  . right hand surgery    .  Right hip replaced      FAMILY HISTORY: Family History  Problem Relation Age of Onset  . Hypertension Mother   . Hyperlipidemia Mother   . Osteoporosis Mother   . Memory loss Mother   . Macular degeneration Mother   . Hypertension Father   . Hyperlipidemia Father   . Deep vein thrombosis Father   . Heart disease Father        Required pacemaker  . Prostate cancer Father   . Osteopenia Sister   . Hypertension Sister   . Other Sister        MVA    SOCIAL HISTORY: Social History   Socioeconomic History  . Marital status: Married    Spouse name: Not on file  . Number of children: 2  . Years of education: Not on file  . Highest education level:  Not on file  Occupational History  . Not on file  Tobacco Use  . Smoking status: Former Games developer  . Smokeless tobacco: Never Used  . Tobacco comment: quit in 2003  Vaping Use  . Vaping Use: Never used  Substance and Sexual Activity  . Alcohol use: Yes    Alcohol/week: 2.0 - 3.0 standard drinks    Types: 2 - 3 Glasses of wine per week    Comment: per week  . Drug use: Never  . Sexual activity: Yes  Other Topics Concern  . Not on file  Social History Narrative   Lives with husband   Caffeine- 2- 3 cups per day    Right handed    Social Determinants of Health   Financial Resource Strain: Not on file  Food Insecurity: Not on file  Transportation Needs: Not on file  Physical Activity: Not on file  Stress: Not on file  Social Connections: Not on file  Intimate Partner Violence: Not on file     PHYSICAL EXAM  GENERAL EXAM/CONSTITUTIONAL: Vitals:  Vitals:   11/04/20 0824  BP: (!) 145/81  Pulse: 72  Weight: 138 lb 2 oz (62.7 kg)  Height: 5\' 3"  (1.6 m)     Body mass index is 24.47 kg/m. Wt Readings from Last 3 Encounters:  11/04/20 138 lb 2 oz (62.7 kg)  01/03/20 143 lb 6.4 oz (65 kg)  11/15/19 139 lb 8.8 oz (63.3 kg)     Patient is in no distress; well developed, nourished and groomed; neck is supple  CARDIOVASCULAR:  Examination of carotid arteries is normal; no carotid bruits  Regular rate and rhythm, no murmurs  Examination of peripheral vascular system by observation and palpation is normal  EYES:  Ophthalmoscopic exam of optic discs and posterior segments is normal; no papilledema or hemorrhages  Hearing Screening   125Hz  250Hz  500Hz  1000Hz  2000Hz  3000Hz  4000Hz  6000Hz  8000Hz   Right ear:           Left ear:             Visual Acuity Screening   Right eye Left eye Both eyes  Without correction: 20/50 20/30   With correction:        MUSCULOSKELETAL:  Gait, strength, tone, movements noted in Neurologic exam below  NEUROLOGIC: MENTAL STATUS:   No flowsheet data found.  awake, alert, oriented to person, place and time  recent and remote memory intact  normal attention and concentration  language fluent, comprehension intact, naming intact  fund of knowledge appropriate  CRANIAL NERVE:   2nd - no papilledema on fundoscopic exam  2nd, 3rd, 4th, 6th -  pupils equal and reactive to light, visual fields full to confrontation, extraocular muscles intact, no nystagmus  5th - facial sensation symmetric  7th - facial strength symmetric  8th - hearing intact  9th - palate elevates symmetrically, uvula midline  11th - shoulder shrug symmetric  12th - tongue protrusion midline  MOTOR:   normal bulk and tone, full strength in the BUE, BLE  SENSORY:   normal and symmetric to light touch, temperature, vibration  COORDINATION:   finger-nose-finger, fine finger movements normal  REFLEXES:   deep tendon reflexes present and symmetric  GAIT/STATION:   narrow based gait     DIAGNOSTIC DATA (LABS, IMAGING, TESTING) - I reviewed patient records, labs, notes, testing and imaging myself where available.  Lab Results  Component Value Date   WBC 7.0 11/16/2019   HGB 9.0 (L) 11/16/2019   HCT 27.3 (L) 11/16/2019   MCV 91.6 11/16/2019   PLT 184 11/16/2019      Component Value Date/Time   NA 142 11/16/2019 0537   K 4.0 11/16/2019 0537   CL 108 11/16/2019 0537   CO2 27 11/16/2019 0537   GLUCOSE 92 11/16/2019 0537   BUN 10 11/16/2019 0537   CREATININE 0.54 11/16/2019 0537   CALCIUM 8.1 (L) 11/16/2019 0537   PROT 5.4 (L) 11/13/2019 1015   ALBUMIN 2.6 (L) 11/13/2019 1015   AST 16 11/13/2019 1015   ALT 13 11/13/2019 1015   ALKPHOS 74 11/13/2019 1015   BILITOT 0.6 11/13/2019 1015   GFRNONAA >60 11/16/2019 0537   GFRAA >60 11/16/2019 0537   No results found for: CHOL, HDL, LDLCALC, LDLDIRECT, TRIG, CHOLHDL No results found for: VQXI5W No results found for: VITAMINB12 No results found for:  TSH    ASSESSMENT AND PLAN  65 y.o. year old female here with:  Dx:  1. Episodic lightheadedness     PLAN:  EPISODIC LIGHTHEADEDNESS / BLURRED VISION / DIZZINESS (unclear etiology; unprovoked events) - check MRI brain, labs - refer to cardiology (eval for non-exertional pre-syncope; rule out arrhythmia; request carotid u/s and TTE)  Orders Placed This Encounter  Procedures  . MR BRAIN W WO CONTRAST  . Vitamin B12  . Hemoglobin A1c  . TSH  . CBC with Differential/Platelet  . Comprehensive metabolic panel  . Ambulatory referral to Cardiology   Return for pending if symptoms worsen or fail to improve.     Suanne Marker, MD 11/04/2020, 9:13 AM Certified in Neurology, Neurophysiology and Neuroimaging  [facility] [address] [place], [place] [address] [phone]

## 2020-11-04 NOTE — Patient Instructions (Signed)
EPISODIC LIGHTHEADEDNESS / BLURRED VISION / DIZZINESS - check MRI brain - refer to cardiology (eval for non-exertional pre-syncope; rule out arrhythmia; request carotid u/s and TTE)

## 2020-11-05 LAB — HEMOGLOBIN A1C
Est. average glucose Bld gHb Est-mCnc: 108 mg/dL
Hgb A1c MFr Bld: 5.4 % (ref 4.8–5.6)

## 2020-11-05 LAB — COMPREHENSIVE METABOLIC PANEL
ALT: 14 IU/L (ref 0–32)
AST: 20 IU/L (ref 0–40)
Albumin/Globulin Ratio: 1.5 (ref 1.2–2.2)
Albumin: 4.3 g/dL (ref 3.8–4.8)
Alkaline Phosphatase: 68 IU/L (ref 44–121)
BUN/Creatinine Ratio: 22 (ref 12–28)
BUN: 16 mg/dL (ref 8–27)
Bilirubin Total: 0.3 mg/dL (ref 0.0–1.2)
CO2: 25 mmol/L (ref 20–29)
Calcium: 9.4 mg/dL (ref 8.7–10.3)
Chloride: 102 mmol/L (ref 96–106)
Creatinine, Ser: 0.73 mg/dL (ref 0.57–1.00)
Globulin, Total: 2.8 g/dL (ref 1.5–4.5)
Glucose: 91 mg/dL (ref 65–99)
Potassium: 4.6 mmol/L (ref 3.5–5.2)
Sodium: 140 mmol/L (ref 134–144)
Total Protein: 7.1 g/dL (ref 6.0–8.5)
eGFR: 92 mL/min/{1.73_m2} (ref >59)

## 2020-11-05 LAB — CBC WITH DIFFERENTIAL/PLATELET
Basophils Absolute: 0.1 10*3/uL (ref 0.0–0.2)
Basos: 1 %
EOS (ABSOLUTE): 0.1 10*3/uL (ref 0.0–0.4)
Eos: 3 %
Hematocrit: 42.4 % (ref 34.0–46.6)
Hemoglobin: 13.6 g/dL (ref 11.1–15.9)
Immature Grans (Abs): 0 10*3/uL (ref 0.0–0.1)
Immature Granulocytes: 0 %
Lymphocytes Absolute: 1 10*3/uL (ref 0.7–3.1)
Lymphs: 19 %
MCH: 29.5 pg (ref 26.6–33.0)
MCHC: 32.1 g/dL (ref 31.5–35.7)
MCV: 92 fL (ref 79–97)
Monocytes Absolute: 0.4 10*3/uL (ref 0.1–0.9)
Monocytes: 9 %
Neutrophils Absolute: 3.4 10*3/uL (ref 1.4–7.0)
Neutrophils: 68 %
Platelets: 198 10*3/uL (ref 150–450)
RBC: 4.61 x10E6/uL (ref 3.77–5.28)
RDW: 12.3 % (ref 11.7–15.4)
WBC: 5 10*3/uL (ref 3.4–10.8)

## 2020-11-05 LAB — VITAMIN B12: Vitamin B-12: 794 pg/mL (ref 232–1245)

## 2020-11-05 LAB — TSH: TSH: 3.49 u[IU]/mL (ref 0.450–4.500)

## 2020-11-06 ENCOUNTER — Telehealth: Payer: Self-pay | Admitting: Diagnostic Neuroimaging

## 2020-11-06 NOTE — Telephone Encounter (Signed)
[employer] [facility] Auth: [id number] (exp. 11/06/20 to 02/04/21) order sent to GI. [facility] is not in network for the MRI GI will reach out to the patient to schedule.

## 2020-11-14 ENCOUNTER — Encounter: Payer: Self-pay | Admitting: *Deleted

## 2020-11-19 ENCOUNTER — Other Ambulatory Visit: Payer: Self-pay

## 2020-11-19 ENCOUNTER — Ambulatory Visit
Admission: RE | Admit: 2020-11-19 | Discharge: 2020-11-19 | Disposition: A | Discharge status: Home / Self Care | Payer: No Typology Code available for payment source | Source: Ambulatory Visit | Attending: Diagnostic Neuroimaging | Admitting: Diagnostic Neuroimaging

## 2020-11-19 DIAGNOSIS — R42 Dizziness and giddiness: Secondary | ICD-10-CM

## 2020-11-19 MED ORDER — GADOBENATE DIMEGLUMINE 529 MG/ML IV SOLN
15.0000 mL | Freq: Once | INTRAVENOUS | Status: AC | PRN
  Administered 2020-11-19: 15 mL via INTRAVENOUS

## 2020-12-13 ENCOUNTER — Encounter: Payer: Self-pay | Admitting: Cardiology

## 2020-12-13 ENCOUNTER — Other Ambulatory Visit: Payer: Self-pay

## 2020-12-13 ENCOUNTER — Ambulatory Visit: Payer: No Typology Code available for payment source | Admitting: Cardiology

## 2020-12-13 VITALS — BP 131/75 | HR 76 | Temp 97.9°F | Resp 17 | Ht 63.0 in | Wt 139.0 lb

## 2020-12-13 DIAGNOSIS — R55 Syncope and collapse: Secondary | ICD-10-CM

## 2020-12-13 DIAGNOSIS — I10 Essential (primary) hypertension: Secondary | ICD-10-CM

## 2020-12-13 DIAGNOSIS — I341 Nonrheumatic mitral (valve) prolapse: Secondary | ICD-10-CM

## 2020-12-13 MED ORDER — METOPROLOL SUCCINATE ER 25 MG PO TB24: 25.0000 mg | ORAL_TABLET | Freq: Every day | ORAL | 2 refills | Status: DC

## 2020-12-13 NOTE — Progress Notes (Signed)
Primary Physician/Referring:  Sigmund HazelMiller, Lisa, MD  Patient ID: Lindsey Stone, female    DOB: [birth year], 65 y.o.   MRN: [id number]  Chief Complaint  Patient presents with   New Patient (Initial Visit)   Dizziness    Ref by Dr. Levada DyPennumalli   HPI:    Lindsey BaumgartnerJanice M Stone  is a 65 y.o. fairly active Caucasian female patient with mild hypertension, referred to me by Dr. Marjory LiesPenumalli for evaluation of visual disturbances, near syncopal episodes.  Patient states that over the past 5 years or so she started having sudden onset of visual disturbances.  She feels darkness, watery eyes and feels hazy then feels dizzy and thinks that she is going to pass out.  These episodes have always happen while she is either sitting or standing and now when she is laying down.  Episodes last very transiently for 30 seconds to a minute at most.  She has never had a frank syncope except when she had massive GI bleed in the past from hemorrhoids.  Her last episode was about 3 months ago in February 2022.  There is no history of diabetes, she smoked tobacco many years ago (5-pack-year history), there is no family history of sudden cardiac death or premature coronary artery disease or vascular disease.  Past Medical History:  Diagnosis Date   Allergic rhinitis    Anal skin tag    Arthritis    Atrial fibrillation (HCC)    Blepharitis    Dermatitis, seborrheic    Diverticulosis    Hair loss    Hypertension    Osteopenia    Primary osteoarthritis of right hip    Vitamin D deficiency    Past Surgical History:  Procedure Laterality Date   BREAST BIOPSY Right 1983   BUNIONECTOMY Right 2010   HEMORRHOID BANDING  11/2011   medoff   HEMORRHOID SURGERY  2012   IR ANGIOGRAM SELECTIVE EACH ADDITIONAL VESSEL  11/13/2019   IR ANGIOGRAM VISCERAL SELECTIVE  11/13/2019   IR EMBO ART  VEN HEMORR LYMPH EXTRAV  INC GUIDE ROADMAPPING  11/13/2019   IR US GUIDE VASC ACCESS RIGHT  11/13/2019   right hand surgery     Right hip  replaced     Family History  Problem Relation Age of Onset   Hypertension Mother    Hyperlipidemia Mother    Osteoporosis Mother    Memory loss Mother    Macular degeneration Mother    Hypertension Father    Hyperlipidemia Father    Deep vein thrombosis Father    Heart disease Father        Required pacemaker   Prostate cancer Father    Osteopenia Sister    Hypertension Sister    Other Sister        MVA    Social History   Tobacco Use   Smoking status: Former    Packs/day: 0.25    Years: 20.00    Pack years: 5.00    Types: Cigarettes    Quit date: 12/04/2000    Years since quitting: 20.0   Smokeless tobacco: Never   Tobacco comments:    quit in 2003  Substance Use Topics   Alcohol use: Yes    Alcohol/week: 2.0 - 3.0 standard drinks    Types: 2 - 3 Glasses of wine per week    Comment: per week   Marital Status: Married  ROS  Review of Systems  Cardiovascular:  Negative for chest pain, dyspnea on exertion and  leg swelling.  Gastrointestinal:  Negative for melena.  Objective  Blood pressure 131/75, pulse 76, temperature 97.9 F (36.6 C), temperature source Temporal, resp. rate 17, height 5\' 3"  (1.6 m), weight 139 lb (63 kg), SpO2 95 %. Body mass index is 24.62 kg/m.  Vitals with BMI 12/13/2020 11/04/2020 01/03/2020  Height 5\' 3"  5\' 3"  5\' 3"   Weight 139 lbs 138 lbs 2 oz 143 lbs 6 oz  BMI 24.63 24.47 25.41  Systolic 131 145 01/05/2020  Diastolic 75 81 68  Pulse 76 72 81     Physical Exam HENT:     Head: Atraumatic.  Neck:     Vascular: No carotid bruit or JVD.  Cardiovascular:     Rate and Rhythm: Normal rate and regular rhythm.     Pulses: Intact distal pulses.     Heart sounds: S1 normal and S2 normal. Murmur heard.  Crescendo-decrescendo mid to late systolic murmur is present with a grade of 2/6 at the apex.    No gallop.  Pulmonary:     Effort: Pulmonary effort is normal.     Breath sounds: Normal breath sounds.  Abdominal:     General: Bowel sounds are  normal.     Palpations: Abdomen is soft.     Laboratory examination:   Recent Labs    11/04/20 0947  NA 140  K 4.6  CL 102  CO2 25  GLUCOSE 91  BUN 16  CREATININE 0.73  CALCIUM 9.4   CrCl cannot be calculated (Patient's most recent lab result is older than the maximum 21 days allowed.).  CMP Latest Ref Rng & Units 11/04/2020 11/16/2019 11/15/2019  Glucose 65 - 99 mg/dL 91 92 91  BUN 8 - 27 mg/dL 16 10 12   Creatinine 0.57 - 1.00 mg/dL 01/04/21 01/04/2021 11/18/2019  Sodium 134 - 144 mmol/L 140 142 142  Potassium 3.5 - 5.2 mmol/L 4.6 4.0 3.3(L)  Chloride 96 - 106 mmol/L 102 108 112(H)  CO2 20 - 29 mmol/L 25 27 22   Calcium 8.7 - 10.3 mg/dL 9.4 01/15/2020) 7.5(L)  Total Protein 6.0 - 8.5 g/dL 7.1 - -  Total Bilirubin 0.0 - 1.2 mg/dL 0.3 - -  Alkaline Phos 44 - 121 IU/L 68 - -  AST 0 - 40 IU/L 20 - -  ALT 0 - 32 IU/L 14 - -   CBC Latest Ref Rng & Units 11/04/2020 11/16/2019 11/15/2019  WBC 3.4 - 10.8 x10E3/uL 5.0 7.0 -  Hemoglobin 11.1 - 15.9 g/dL 3.35 9.0(L) 8.6(L)  Hematocrit 34.0 - 46.6 % 42.4 27.3(L) 25.9(L)  Platelets 150 - 450 x10E3/uL 198 184 -   HEMOGLOBIN A1C Lab Results  Component Value Date   HGBA1C 5.4 11/04/2020   TSH Recent Labs    11/04/20 0947  TSH 3.490    External labs:   Labs 08/01/2020:  Hb 14.1/HCT 41.1, platelets 194, normal indicis.  Serum glucose 99 mg, BUN 12, creatinine 0.75, potassium 4.5.  CMP otherwise normal.  Vitamin D 24.2  Total cholesterol 170, triglycerides 106, HDL 59, LDL 92.  Non-HDL cholesterol 106.  Medications and allergies  No Known Allergies    Medication prior to this encounter:   Outpatient Medications Prior to Visit  Medication Sig Dispense Refill   Ascorbic Acid (VITAMIN C) 100 MG tablet Take 100 mg by mouth daily.     Cholecalciferol (VITAMIN D) 50 MCG (2000 UT) CAPS Take 1 capsule by mouth daily.     estradiol (ESTRACE) 2 MG tablet Take 2  mg by mouth daily.     estradiol (ESTRACE) 2 MG tablet Take 1 tablet by mouth daily.      Magnesium 250 MG TABS Take 1 tablet by mouth daily.     progesterone (PROMETRIUM) 100 MG capsule Take 100 mg by mouth See admin instructions. Take 1 capsule (100mg ) by mouth for 10 days. Every 2 months     hydrochlorothiazide (HYDRODIURIL) 12.5 MG tablet Take 12.5 mg by mouth daily.     OVER THE COUNTER MEDICATION Take 1 tablet by mouth daily as needed. OTC allergy medicine     No facility-administered medications prior to visit.     FINAL MEDICATION AS OF TODAY:   Medications after current encounter Current Outpatient Medications  Medication Instructions   Cholecalciferol (VITAMIN D) 50 MCG (2000 UT) CAPS 1 capsule, Oral, Daily   estradiol (ESTRACE) 2 MG tablet 1 tablet, Oral, Daily   estradiol (ESTRACE) 2 mg, Oral, Daily   Magnesium 250 MG TABS 1 tablet, Oral, Daily   metoprolol succinate (TOPROL-XL) 25 mg, Oral, Daily, Take with or immediately following a meal.   progesterone (PROMETRIUM) 100 mg, Oral, See admin instructions, Take 1 capsule (100mg ) by mouth for 10 days. Every 2 months    vitamin C 100 mg, Oral, Daily    Radiology:   No results found.  Cardiac Studies:   None EKG:     EKG 12/13/2020: Normal sinus rhythm at rate of 65 bpm, normal axis.  No evidence of ischemia, normal EKG.      Assessment     ICD-10-CM   1. Near syncope  R55 EKG 12-Lead    metoprolol succinate (TOPROL-XL) 25 MG 24 hr tablet    PCV CAROTID DUPLEX (BILATERAL)    PCV ECHOCARDIOGRAM COMPLETE    2. Mitral valve prolapse  I34.1 PCV ECHOCARDIOGRAM COMPLETE    3. Primary hypertension  I10 metoprolol succinate (TOPROL-XL) 25 MG 24 hr tablet       Medications Discontinued During This Encounter  Medication Reason   OVER THE COUNTER MEDICATION Error   hydrochlorothiazide (HYDRODIURIL) 12.5 MG tablet Change in therapy    Meds ordered this encounter  Medications   metoprolol succinate (TOPROL-XL) 25 MG 24 hr tablet    Sig: Take 1 tablet (25 mg total) by mouth daily. Take with or immediately  following a meal.    Dispense:  30 tablet    Refill:  2    Discontinue HCTZ   Orders Placed This Encounter  Procedures   EKG 12-Lead   PCV ECHOCARDIOGRAM COMPLETE    Standing Status:   Future    Standing Expiration Date:   12/13/2021   Recommendations:   RAEYA MERRITTS is a 65 y.o.  fairly active Caucasian female patient with mild hypertension, referred to me by Dr. Arty Stone for evaluation of visual disturbances, near syncopal episodes.  Episode started about 5 years ago, episodes are very sporadic and brief and last for a few seconds.  Described as blurry vision followed by feeling dizzy and feel like she is going to pass out.  Occasionally she has had palpitations with that.  Last episode was in February 2022.  I suspect vasovagal near syncope.  She has premonitory symptoms with every episode.  Her neurologic work-up and ophthalmologic work-up has been negative and her physical examination today except for presence of mitral valve prolapse is negative as well.  I would like to discontinue hydrochlorothiazide which may be contributing to some of the symptoms and switch her to beta-blocker  therapy at low-dose, these episodes respond well to beta-blocker therapy.  Obtain carotid artery duplex and an echocardiogram and have like to see her back in 6 to 8 weeks lower follow-up.  Counterpressure maneuver discussed with the patient and fall precautions discussed with the patient.  I do not think an event monitor will yield arrhythmic episodes as they are sparse and widely spread.  If she still continues to have episodes, we could consider loop recorder implantation.   Yates Decamp, MD, [facility] 12/13/2020, 5:55 PM Office: [phone]

## 2020-12-17 ENCOUNTER — Telehealth: Payer: Self-pay | Admitting: *Deleted

## 2020-12-17 NOTE — Telephone Encounter (Signed)
Lindsey Stone, please clarify, which results are you asking me to report?  Her brain MRI was from 11/19/2020. In case, she has not received results from that scan, please advise her that there were no acute findings.  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  No immediate action is required on this test but she can discuss with Dr. Marjory Lies further with regards to additional evaluation or recommendations upon his return.

## 2020-12-17 NOTE — Telephone Encounter (Signed)
LVM requesting call back for MRI results. 

## 2020-12-18 NOTE — Telephone Encounter (Signed)
Called patient and reviewed Dr Teofilo Pod MRI result note. She had seen it in her chart. Answered her quesiotns to her stated satisfaction. She has seen Dr Jacinto Halim, getting further work up with Echocardiogram, carotid US. I advised she call for results if she hasn't heard back in a week. Patient verbalized understanding, appreciation.

## 2020-12-31 ENCOUNTER — Other Ambulatory Visit: Payer: No Typology Code available for payment source

## 2021-01-01 ENCOUNTER — Other Ambulatory Visit: Payer: No Typology Code available for payment source

## 2021-01-01 ENCOUNTER — Ambulatory Visit: Payer: No Typology Code available for payment source

## 2021-01-01 ENCOUNTER — Other Ambulatory Visit: Payer: Self-pay

## 2021-01-01 DIAGNOSIS — R55 Syncope and collapse: Secondary | ICD-10-CM

## 2021-01-01 DIAGNOSIS — I341 Nonrheumatic mitral (valve) prolapse: Secondary | ICD-10-CM

## 2021-01-05 NOTE — Progress Notes (Signed)
Carotid artery duplex 01/01/2021: Duplex suggests stenosis in the right internal carotid artery (1-15%). Duplex suggests stenosis in the left internal carotid artery (1-15%). Minimal diffuse plaque noted in bilateral carotid arteries. Antegrade right vertebral artery flow. Antegrade left vertebral artery flow.

## 2021-01-07 NOTE — Progress Notes (Signed)
Echocardiogram 01/01/2021: Left ventricle cavity is normal in size. Mild concentric hypertrophy of the left ventricle. Normal LV systolic function with EF 55%. Normal global wall motion. Diastolic function not assessed due to severity of mitral regurgitation. Left atrial cavity is mildly dilated. Mild prolapse of the mitral valve leaflets. Moderate (Grade III), posteriorly directed mitral regurgitation. Mild to moderate tricuspid regurgitation. Estimated pulmonary artery systolic pressure 33 mmHg.

## 2021-01-29 ENCOUNTER — Other Ambulatory Visit: Payer: Self-pay

## 2021-01-29 ENCOUNTER — Ambulatory Visit: Payer: No Typology Code available for payment source | Admitting: Cardiology

## 2021-01-29 ENCOUNTER — Encounter: Payer: Self-pay | Admitting: Cardiology

## 2021-01-29 VITALS — BP 122/75 | HR 73 | Temp 99.0°F | Resp 17 | Ht 63.0 in | Wt 136.4 lb

## 2021-01-29 DIAGNOSIS — I341 Nonrheumatic mitral (valve) prolapse: Secondary | ICD-10-CM

## 2021-01-29 DIAGNOSIS — I340 Nonrheumatic mitral (valve) insufficiency: Secondary | ICD-10-CM

## 2021-01-29 DIAGNOSIS — R55 Syncope and collapse: Secondary | ICD-10-CM

## 2021-01-29 DIAGNOSIS — I10 Essential (primary) hypertension: Secondary | ICD-10-CM

## 2021-01-29 MED ORDER — METOPROLOL SUCCINATE ER 25 MG PO TB24: 25.0000 mg | ORAL_TABLET | Freq: Every day | ORAL | 3 refills | Status: DC

## 2021-01-29 NOTE — Progress Notes (Signed)
Primary Physician/Referring:  Sigmund HazelMiller, Lisa, MD  Patient ID: Lindsey BaumgartnerJanice M Stone, female    DOB: 12-Apr-1956, 65 y.o.   MRN: [id number]  Chief Complaint  Patient presents with   Near Syncope    6 WEEKS    HPI:    Lindsey BaumgartnerJanice M Westwood  is a 65 y.o. fairly active Caucasian female patient with mild hypertension, referred to me by Dr. Marjory LiesPenumalli for evaluation of visual disturbances, near syncopal episodes.  Patient states that over the past 5 years or so she started having sudden onset of visual disturbances.  She feels darkness, watery eyes and feels hazy then feels dizzy and thinks that she is going to pass out.  Her last episode was about 3 months ago in February 2022.  On her last office visit I started her on metoprolol and discontinued HCTZ, echocardiogram was obtained.  She is tolerating the medication well and has noticed blood pressure to be well controlled.  She has not had any episodes of near syncope since last office visit.  There is no history of diabetes, she smoked tobacco many years ago (5-pack-year history), there is no family history of sudden cardiac death or premature coronary artery disease or vascular disease.  Past Medical History:  Diagnosis Date   Allergic rhinitis    Anal skin tag    Arthritis    Atrial fibrillation (HCC)    Blepharitis    Dermatitis, seborrheic    Diverticulosis    Hair loss    Hypertension    Osteopenia    Primary osteoarthritis of right hip    Vitamin D deficiency    Past Surgical History:  Procedure Laterality Date   BREAST BIOPSY Right 1983   BUNIONECTOMY Right 2010   HEMORRHOID BANDING  11/2011   medoff   HEMORRHOID SURGERY  2012   IR ANGIOGRAM SELECTIVE EACH ADDITIONAL VESSEL  11/13/2019   IR ANGIOGRAM VISCERAL SELECTIVE  11/13/2019   IR EMBO ART  VEN HEMORR LYMPH EXTRAV  INC GUIDE ROADMAPPING  11/13/2019   IR US GUIDE VASC ACCESS RIGHT  11/13/2019   right hand surgery     Right hip replaced     Family History  Problem Relation Age of  Onset   Hypertension Mother    Hyperlipidemia Mother    Osteoporosis Mother    Memory loss Mother    Macular degeneration Mother    Hypertension Father    Hyperlipidemia Father    Deep vein thrombosis Father    Heart disease Father        Required pacemaker   Prostate cancer Father    Osteopenia Sister    Hypertension Sister    Other Sister        MVA    Social History   Tobacco Use   Smoking status: Former    Packs/day: 0.25    Years: 20.00    Pack years: 5.00    Types: Cigarettes    Quit date: 12/04/2000    Years since quitting: 20.1   Smokeless tobacco: Never   Tobacco comments:    quit in 2003  Substance Use Topics   Alcohol use: Yes    Alcohol/week: 2.0 - 3.0 standard drinks    Types: 2 - 3 Glasses of wine per week    Comment: per week   Marital Status: Married  ROS  Review of Systems  Cardiovascular:  Negative for chest pain, dyspnea on exertion and leg swelling.  Gastrointestinal:  Negative for melena.  Objective  Blood  pressure 122/75, pulse 73, temperature 99 F (37.2 C), temperature source Temporal, resp. rate 17, height 5\' 3"  (1.6 m), weight 136 lb 6.4 oz (61.9 kg), SpO2 98 %. Body mass index is 24.16 kg/m.  Vitals with BMI 01/29/2021 12/13/2020 11/04/2020  Height 5\' 3"  5\' 3"  5\' 3"   Weight 136 lbs 6 oz 139 lbs 138 lbs 2 oz  BMI 24.17 24.63 24.47  Systolic 122 131 01/04/2021  Diastolic 75 75 81  Pulse 73 76 72     Physical Exam HENT:     Head: Atraumatic.  Neck:     Vascular: No carotid bruit or JVD.  Cardiovascular:     Rate and Rhythm: Normal rate and regular rhythm.     Pulses: Intact distal pulses.     Heart sounds: S1 normal and S2 normal. Murmur heard.  Crescendo-decrescendo mid to late systolic murmur is present with a grade of 2/6 at the apex.    No gallop.  Pulmonary:     Effort: Pulmonary effort is normal.     Breath sounds: Normal breath sounds.  Abdominal:     General: Bowel sounds are normal.     Palpations: Abdomen is soft.      Laboratory examination:   Recent Labs    11/04/20 0947  NA 140  K 4.6  CL 102  CO2 25  GLUCOSE 91  BUN 16  CREATININE 0.73  CALCIUM 9.4   CrCl cannot be calculated (Patient's most recent lab result is older than the maximum 21 days allowed.).  CMP Latest Ref Rng & Units 11/04/2020 11/16/2019 11/15/2019  Glucose 65 - 99 mg/dL 91 92 91  BUN 8 - 27 mg/dL 16 10 12   Creatinine 0.57 - 1.00 mg/dL 01/04/21 01/04/2021 11/18/2019  Sodium 134 - 144 mmol/L 140 142 142  Potassium 3.5 - 5.2 mmol/L 4.6 4.0 3.3(L)  Chloride 96 - 106 mmol/L 102 108 112(H)  CO2 20 - 29 mmol/L 25 27 22   Calcium 8.7 - 10.3 mg/dL 9.4 01/15/2020) 7.5(L)  Total Protein 6.0 - 8.5 g/dL 7.1 - -  Total Bilirubin 0.0 - 1.2 mg/dL 0.3 - -  Alkaline Phos 44 - 121 IU/L 68 - -  AST 0 - 40 IU/L 20 - -  ALT 0 - 32 IU/L 14 - -   CBC Latest Ref Rng & Units 11/04/2020 11/16/2019 11/15/2019  WBC 3.4 - 10.8 x10E3/uL 5.0 7.0 -  Hemoglobin 11.1 - 15.9 g/dL 5.09 9.0(L) 8.6(L)  Hematocrit 34.0 - 46.6 % 42.4 27.3(L) 25.9(L)  Platelets 150 - 450 x10E3/uL 198 184 -   HEMOGLOBIN A1C Lab Results  Component Value Date   HGBA1C 5.4 11/04/2020   TSH Recent Labs    11/04/20 0947  TSH 3.490    External labs:   Labs 08/01/2020:  Hb 14.1/HCT 41.1, platelets 194, normal indicis.  Serum glucose 99 mg, BUN 12, creatinine 0.75, potassium 4.5.  CMP otherwise normal.  Vitamin D 24.2  Total cholesterol 170, triglycerides 106, HDL 59, LDL 92.  Non-HDL cholesterol 106.  Medications and allergies  No Known Allergies   Medication prior to this encounter:   Outpatient Medications Prior to Visit  Medication Sig Dispense Refill   Ascorbic Acid (VITAMIN C) 100 MG tablet Take 100 mg by mouth daily.     Cholecalciferol (VITAMIN D) 50 MCG (2000 UT) CAPS Take 1 capsule by mouth daily.     estradiol (ESTRACE) 2 MG tablet Take 2 mg by mouth daily.     Magnesium 250 MG  TABS Take 1 tablet by mouth daily.     progesterone (PROMETRIUM) 100 MG capsule Take 100 mg by  mouth See admin instructions. Take 1 capsule (100mg ) by mouth for 10 days. Every 2 months     metoprolol succinate (TOPROL-XL) 25 MG 24 hr tablet Take 1 tablet (25 mg total) by mouth daily. Take with or immediately following a meal. 30 tablet 2   estradiol (ESTRACE) 2 MG tablet Take 1 tablet by mouth daily.     No facility-administered medications prior to visit.     FINAL MEDICATION AS OF TODAY:   Medications after current encounter Current Outpatient Medications  Medication Instructions   Cholecalciferol (VITAMIN D) 50 MCG (2000 UT) CAPS 1 capsule, Oral, Daily   estradiol (ESTRACE) 2 mg, Oral, Daily   Magnesium 250 MG TABS 1 tablet, Oral, Daily   metoprolol succinate (TOPROL-XL) 25 mg, Oral, Daily, Take with or immediately following a meal.   progesterone (PROMETRIUM) 100 mg, Oral, See admin instructions, Take 1 capsule (100mg ) by mouth for 10 days. Every 2 months    vitamin C 100 mg, Oral, Daily    Radiology:   No results found.  Cardiac Studies:   Carotid artery duplex 20-Jan-2021: Duplex suggests stenosis in the right internal carotid artery (1-15%). Duplex suggests stenosis in the left internal carotid artery (1-15%). Minimal diffuse plaque noted in bilateral carotid arteries. Antegrade right vertebral artery flow. Antegrade left vertebral artery flow.  PCV ECHOCARDIOGRAM COMPLETE 01-20-2021 Left ventricle cavity is normal in size. Mild concentric hypertrophy of the left ventricle. Normal LV systolic function with EF 55%. Normal global wall motion. Diastolic function not assessed due to severity of mitral regurgitation. Left atrial cavity is mildly dilated. Mild prolapse of the mitral valve leaflets. Moderate (Grade III), posteriorly directed mitral regurgitation. Mild to moderate tricuspid regurgitation. Estimated pulmonary artery systolic pressure 33 mmHg.    EKG:   EKG 12/13/2020: Normal sinus rhythm at rate of 65 bpm, normal axis.  No evidence of ischemia, normal EKG.       Assessment     ICD-10-CM   1. Vasovagal syncope  R55 metoprolol succinate (TOPROL-XL) 25 MG 24 hr tablet    2. Mitral valve prolapse  I34.1 PCV ECHOCARDIOGRAM COMPLETE    3. Moderate mitral regurgitation  I34.0 PCV ECHOCARDIOGRAM COMPLETE    4. Primary hypertension  I10 metoprolol succinate (TOPROL-XL) 25 MG 24 hr tablet      Medications Discontinued During This Encounter  Medication Reason   estradiol (ESTRACE) 2 MG tablet Error   metoprolol succinate (TOPROL-XL) 25 MG 24 hr tablet Reorder    Meds ordered this encounter  Medications   metoprolol succinate (TOPROL-XL) 25 MG 24 hr tablet    Sig: Take 1 tablet (25 mg total) by mouth daily. Take with or immediately following a meal.    Dispense:  90 tablet    Refill:  3    Discontinue HCTZ   Orders Placed This Encounter  Procedures   PCV ECHOCARDIOGRAM COMPLETE    Standing Status:   Future    Standing Expiration Date:   01/29/2022   Recommendations:   MADYN IVINS is a 65 y.o.  fairly active Caucasian female patient with mild hypertension, referred to me by Dr. Arty Stone for evaluation of visual disturbances, near syncopal episodes.  Episode started about 5 years ago, episodes are very sporadic and brief and last for a few seconds.  Described as blurry vision followed by feeling dizzy and feel like she is going to  pass out.  Occasionally she has had palpitations with that.  Last episode was in February 2022.  I suspect vasovagal near syncope.  She has premonitory symptoms with every episode.  Her neurologic work-up and ophthalmologic work-up has been negative and her physical examination today except for presence of mitral valve prolapse is negative as well.  I reviewed the results of the echocardiogram, moderate posteriorly directed MR from mitral valve prolapse.  She does not need endocarditis prophylaxis.  With regard to vasovagal episodes, episodes are very few, I have started her on beta-blocker therapy for the same  which she is tolerating.  Advised her to continue same.  Unless she has frequent episodes of near syncope again then we could consider loop recorder implantation.  Patient is aware of counterpressure maneuvers and to lay down immediately if she has premonitory symptoms.  Premonitory symptoms.  I will see her back in a year with repeat echocardiogram.  With regard to carotid artery duplex, she has very minimal plaque, I reviewed her lipids, I do not think she needs statin therapy for now unless LDL is >100.  Yates Decamp, MD, [facility] 01/29/2021, 9:33 AM Office: [phone]

## 2021-02-11 ENCOUNTER — Encounter: Payer: Self-pay | Admitting: Gastroenterology

## 2021-02-11 ENCOUNTER — Ambulatory Visit (INDEPENDENT_AMBULATORY_CARE_PROVIDER_SITE_OTHER): Payer: No Typology Code available for payment source | Admitting: Gastroenterology

## 2021-02-11 VITALS — BP 118/60 | HR 71 | Ht 63.0 in | Wt 136.2 lb

## 2021-02-11 DIAGNOSIS — K6289 Other specified diseases of anus and rectum: Secondary | ICD-10-CM | POA: Diagnosis not present

## 2021-02-11 NOTE — Progress Notes (Signed)
     [facility] Progress Note  Chief Complaint: Rectal discomfort  Subjective  History: Lindsey Stone was last seen June 2021 after a hospitalization for large-volume diverticular bleeding with hypotension, controlled with interventional radiology coil embolization.  She had previously seen Lindsey Stone and last colonoscopy in 2018 (report referenced as an addendum to my 01/03/2020 note).  She may have had hemorrhoidal banding in the past, then saw Dr. 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  Lindsey Stone called about 2 weeks ago because she was having perianal pain and swelling without bleeding.  She thought it was reminiscent of having previous hemorrhoid problems.  She applied some creams, use sitz bath's and it subsided after 5 to 7 days.  Bowel habits are regular, she has not had to strain and has not had pain with bowel movements.  Denies abdominal pain, nausea or vomiting.  ROS: Cardiovascular:  no chest pain Respiratory: no dyspnea  The patient's Past Medical, Family and Social History were reviewed and are on file in the EMR.  Objective:  Med list reviewed  Current Outpatient Medications:    Ascorbic Acid (VITAMIN C) 100 MG tablet, Take 100 mg by mouth daily., Disp: , Rfl:    Cholecalciferol (VITAMIN D) 50 MCG (2000 UT) CAPS, Take 1 capsule by mouth daily., Disp: , Rfl:    estradiol (ESTRACE) 2 MG tablet, Take 2 mg by mouth daily., Disp: , Rfl:    Magnesium 250 MG TABS, Take 1 tablet by mouth daily., Disp: , Rfl:    metoprolol succinate (TOPROL-XL) 25 MG 24 hr tablet, Take 1 tablet (25 mg total) by mouth daily. Take with or immediately following a meal., Disp: 90 tablet, Rfl: 3   progesterone (PROMETRIUM) 100 MG capsule, Take 100 mg by mouth See admin instructions. Take 1 capsule (100mg ) by mouth for 10 days. Every 2 months, Disp: , Rfl:    Vital signs in last 24 hrs: Vitals:   02/11/21 1428  BP: 118/60  Pulse: 71  SpO2: 98%   Wt Readings  from Last 3 Encounters:  02/11/21 136 lb 3.2 oz (61.8 kg)  01/29/21 136 lb 6.4 oz (61.9 kg)  12/13/20 139 lb (63 kg)    Physical Exam  Cardiac: RRR without murmurs, S1S2 heard, no peripheral edema Pulm: clear to auscultation bilaterally, normal RR and effort noted Abdomen: soft, no tenderness, with active bowel sounds. No guarding or palpable hepatosplenomegaly. Rectal: Normal perianal exam.  No external hemorrhoids seen.  No fissure or tenderness.  Palpable hypertrophied anal papilla within anal canal. Anoscopy: No swelling or enlargement of the hemorrhoidal plexus, no prolapse.  Hypertrophied anal papilla seen.  Labs:   ___________________________________________ Radiologic studies:   ____________________________________________ Other:   _____________________________________________ Assessment & Plan  Assessment: Encounter Diagnoses  Name Primary?   Perianal pain Yes   Hypertrophied anal papilla    Recent perianal pain with ported swelling in that area.  No internal hemorrhoids seen on anoscopy today, no external hemorrhoids that may have become thrombosed.  May have had swelling of the anal lesion which subsided.  Symptoms resolved now.  If recurrent problem, refer back to Dr. 02/12/21 of colorectal surgery for evaluation.   Drue Dun Stone

## 2021-02-11 NOTE — Patient Instructions (Signed)
If you are age 64 or older, your body mass index should be between 23-30. Your Body mass index is 24.13 kg/m. If this is out of the aforementioned range listed, please consider follow up with your Primary Care Provider.  If you are age 67 or younger, your body mass index should be between 19-25. Your Body mass index is 24.13 kg/m. If this is out of the aformentioned range listed, please consider follow up with your Primary Care Provider.   __________________________________________________________  The [facility] providers would like to encourage you to use [facility] to communicate with providers for non-urgent requests or questions.  Due to long hold times on the telephone, sending your provider a message by [facility] may be a faster and more efficient way to get a response.  Please allow 48 business hours for a response.  Please remember that this is for non-urgent requests.   It was a pleasure to see you today!  Thank you for trusting me with your gastrointestinal care!

## 2021-06-23 ENCOUNTER — Encounter: Payer: Self-pay | Admitting: Gastroenterology

## 2021-10-01 ENCOUNTER — Other Ambulatory Visit: Payer: Self-pay | Admitting: Family Medicine

## 2021-10-01 DIAGNOSIS — E7800 Pure hypercholesterolemia, unspecified: Secondary | ICD-10-CM

## 2021-10-21 ENCOUNTER — Ambulatory Visit
Admission: RE | Admit: 2021-10-21 | Discharge: 2021-10-21 | Disposition: A | Discharge status: Home / Self Care | Payer: No Typology Code available for payment source | Source: Ambulatory Visit | Attending: Family Medicine | Admitting: Family Medicine

## 2021-10-21 ENCOUNTER — Other Ambulatory Visit: Payer: Self-pay | Admitting: Family Medicine

## 2021-10-21 DIAGNOSIS — R1011 Right upper quadrant pain: Secondary | ICD-10-CM

## 2021-10-31 ENCOUNTER — Ambulatory Visit
Admission: RE | Admit: 2021-10-31 | Discharge: 2021-10-31 | Disposition: A | Discharge status: Home / Self Care | Payer: No Typology Code available for payment source | Source: Ambulatory Visit | Attending: Family Medicine | Admitting: Family Medicine

## 2021-10-31 DIAGNOSIS — E7800 Pure hypercholesterolemia, unspecified: Secondary | ICD-10-CM

## 2022-01-08 ENCOUNTER — Other Ambulatory Visit: Payer: Self-pay | Admitting: Family Medicine

## 2022-01-08 DIAGNOSIS — R9389 Abnormal findings on diagnostic imaging of other specified body structures: Secondary | ICD-10-CM

## 2022-02-02 ENCOUNTER — Ambulatory Visit
Admission: RE | Admit: 2022-02-02 | Discharge: 2022-02-02 | Disposition: A | Discharge status: Home / Self Care | Payer: No Typology Code available for payment source | Source: Ambulatory Visit | Attending: Family Medicine | Admitting: Family Medicine

## 2022-02-02 DIAGNOSIS — R9389 Abnormal findings on diagnostic imaging of other specified body structures: Secondary | ICD-10-CM

## 2022-02-10 ENCOUNTER — Telehealth: Payer: Self-pay | Admitting: Hematology and Oncology

## 2022-02-10 NOTE — Telephone Encounter (Signed)
Scheduled appt per 8/8 referral. Pt is aware of appt date and time. Pt is aware to arrive 15 mins prior to appt time and to bring and updated insurance card. Pt is aware of appt location.   

## 2022-02-10 NOTE — Progress Notes (Signed)
[facility] [phone]   [phone]  INITIAL CONSULT NOTE  Patient Care Team: Sigmund Hazel, MD as PCP - General (Family Medicine) Suanne Marker, MD as Consulting Physician (Neurology)  Hematological/Oncological History # Heterozygous Factor V Leiden # Pulmonary Embolism  10/23/2021: CT PE study performed at [facility].  Found to have segmental RIGHT lower lobe pulmonary embolism with associated pulmonary infarction. Possible subsegmental pulmonary emboli at the LEFT lung base. No evidence of RIGHT heart strain. Small RIGHT pleural effusion. Patient on estrogen based OCP which was d/c. Started on eliquis therapy.  02/04/2022: Found to be heterozygous for factor V Leiden. 02/10/2022: establish care with Dr. Leonides Schanz  CHIEF COMPLAINTS/PURPOSE OF CONSULTATION:  " Pulmonary Embolism in setting of Heterozygous Factor V Leiden"  HISTORY OF PRESENTING ILLNESS:  Lindsey Stone 66 y.o. female with medical history significant for atrial fibrillation, diverticulosis, hypertension, osteopenia, and vitamin D deficiency who presents for evaluation of pulmonary embolism in the setting of heterozygous factor V Leiden.  On review of the previous records Lindsey Stone underwent a CT PE study on 10/23/2021 which showed segmental right lower lobe pulmonary embolism with associated pulmonary infarction.  There were also possible subsegmental pulmonary emboli in the left lung base.  Patient was on estrogen-based birth control at that time due to having early menopausal symptoms for over 20 years.  She was told to discontinue this medication and started on Eliquis.  Due to concern for these findings she was referred to hematology for further evaluation and management.  On exam today Lindsey Stone reports that she has been on hormone therapy for early menopause for many years.  She reports that she did continue having periods every so often.  She notes she underwent a hip replacement 2.5  years ago.  Her current blood clot symptoms began recently when she felt her heart was increased rate and she was having shortness of breath while at rest.  She notes that she could not exercise like before.  She notes that she woke up and had a sharp pain in her side which woke her up at night.  She reports that it never completely went away.  She is also having some chills and fever.  She did develop leg pain but no swelling or redness.  On further discussion she notes her family history is remarkable for a blood clot in her father.  She reports he had a "blood clot that went to the heart".  She notes that also that she is a former smoker having quit 20 years ago.  She has a few drinks every few weeks.  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  She otherwise denies any fevers, chills, sweats, nausea, or diarrhea.  A full 10 point ROS is listed below.  MEDICAL HISTORY:  Past Medical History:  Diagnosis Date   Allergic rhinitis    Anal skin tag    Arthritis    Atrial fibrillation (HCC)    Blepharitis    Dermatitis, seborrheic    Diverticulosis    Hair loss    Hypertension    Osteopenia    Primary osteoarthritis of right hip    Vitamin D deficiency     SURGICAL HISTORY: Past Surgical History:  Procedure Laterality Date   BREAST BIOPSY Right 1983   BUNIONECTOMY Right 2010   HEMORRHOID BANDING  11/2011   medoff   HEMORRHOID SURGERY  2012   IR ANGIOGRAM SELECTIVE EACH ADDITIONAL VESSEL  11/13/2019   IR ANGIOGRAM  VISCERAL SELECTIVE  11/13/2019   IR EMBO ART  VEN HEMORR LYMPH EXTRAV  INC GUIDE ROADMAPPING  11/13/2019   IR US GUIDE VASC ACCESS RIGHT  11/13/2019   right hand surgery     Right hip replaced      SOCIAL HISTORY: Social History   Socioeconomic History   Marital status: Married    Spouse name: Not on file   Number of children: 2   Years of education: Not on file   Highest education level: Not on file  Occupational History   Not on file  Tobacco Use   Smoking status:  Former    Packs/day: 0.25    Years: 20.00    Total pack years: 5.00    Types: Cigarettes    Quit date: 12/04/2000    Years since quitting: 21.2   Smokeless tobacco: Never   Tobacco comments:    quit in 2003  Vaping Use   Vaping Use: Never used  Substance and Sexual Activity   Alcohol use: Yes    Alcohol/week: 2.0 - 3.0 standard drinks of alcohol    Types: 2 - 3 Glasses of wine per week    Comment: per week   Drug use: Never   Sexual activity: Yes  Other Topics Concern   Not on file  Social History Narrative   Lives with husband   Caffeine- 2- 3 cups per day    Right handed    Social Determinants of Health   Financial Resource Strain: Not on file  Food Insecurity: Not on file  Transportation Needs: Not on file  Physical Activity: Not on file  Stress: Not on file  Social Connections: Not on file  Intimate Partner Violence: Not on file    FAMILY HISTORY: Family History  Problem Relation Age of Onset   Hypertension Mother    Hyperlipidemia Mother    Osteoporosis Mother    Memory loss Mother    Macular degeneration Mother    Hypertension Father    Hyperlipidemia Father    Deep vein thrombosis Father    Heart disease Father        Required pacemaker   Prostate cancer Father    Osteopenia Sister    Hypertension Sister    Other Sister        MVA    ALLERGIES:  has No Known Allergies.  MEDICATIONS:  Current Outpatient Medications  Medication Sig Dispense Refill   apixaban (ELIQUIS) 2.5 MG TABS tablet Take 1 tablet by mouth 2 (two) times daily.     Cholecalciferol (VITAMIN D3) 125 MCG (5000 UT) CAPS Take by mouth.     Ascorbic Acid (VITAMIN C) 100 MG tablet Take 100 mg by mouth daily.     Magnesium 250 MG TABS Take 1 tablet by mouth daily.     metoprolol succinate (TOPROL-XL) 25 MG 24 hr tablet Take 1 tablet (25 mg total) by mouth daily. Take with or immediately following a meal. 90 tablet 3   No current facility-administered medications for this visit.     REVIEW OF SYSTEMS:   Constitutional: ( - ) fevers, ( - )  chills , ( - ) night sweats Eyes: ( - ) blurriness of vision, ( - ) double vision, ( - ) watery eyes Ears, nose, mouth, throat, and face: ( - ) mucositis, ( - ) sore throat Respiratory: ( - ) cough, ( - ) dyspnea, ( - ) wheezes Cardiovascular: ( - ) palpitation, ( - ) chest discomfort, ( - )  lower extremity swelling Gastrointestinal:  ( - ) nausea, ( - ) heartburn, ( - ) change in bowel habits Skin: ( - ) abnormal skin rashes Lymphatics: ( - ) new lymphadenopathy, ( - ) easy bruising Neurological: ( - ) numbness, ( - ) tingling, ( - ) new weaknesses Behavioral/Psych: ( - ) mood change, ( - ) new changes  All other systems were reviewed with the patient and are negative.  PHYSICAL EXAMINATION:  Vitals:   02/11/22 0911  BP: (!) 140/67  Pulse: 75  Resp: 18  Temp: 98.1 F (36.7 C)  SpO2: 98%   Filed Weights   02/11/22 0911  Weight: 137 lb 6.4 oz (62.3 kg)    GENERAL: well appearing middle-aged Caucasian female in NAD  SKIN: skin color, texture, turgor are normal, no rashes or significant lesions EYES: conjunctiva are pink and non-injected, sclera clear LUNGS: clear to auscultation and percussion with normal breathing effort HEART: regular rate & rhythm and no murmurs and no lower extremity edema Musculoskeletal: no cyanosis of digits and no clubbing  PSYCH: alert & oriented x 3, fluent speech NEURO: no focal motor/sensory deficits  LABORATORY DATA:  I have reviewed the data as listed    Latest Ref Rng & Units 11/04/2020    9:47 AM 11/16/2019    5:37 AM 11/15/2019    8:41 PM  CBC  WBC 3.4 - 10.8 x10E3/uL 5.0  7.0    Hemoglobin 11.1 - 15.9 g/dL 13.6  9.0  8.6   Hematocrit 34.0 - 46.6 % 42.4  27.3  25.9   Platelets 150 - 450 x10E3/uL 198  184         Latest Ref Rng & Units 11/04/2020    9:47 AM 11/16/2019    5:37 AM 11/15/2019    3:55 AM  CMP  Glucose 65 - 99 mg/dL 91  92  91   BUN 8 - 27 mg/dL 16  10  12     Creatinine 0.57 - 1.00 mg/dL 0.73  0.54  0.58   Sodium 134 - 144 mmol/L 140  142  142   Potassium 3.5 - 5.2 mmol/L 4.6  4.0  3.3   Chloride 96 - 106 mmol/L 102  108  112   CO2 20 - 29 mmol/L 25  27  22    Calcium 8.7 - 10.3 mg/dL 9.4  8.1  7.5   Total Protein 6.0 - 8.5 g/dL 7.1     Total Bilirubin 0.0 - 1.2 mg/dL 0.3     Alkaline Phos 44 - 121 IU/L 68     AST 0 - 40 IU/L 20     ALT 0 - 32 IU/L 14        ASSESSMENT & PLAN Lindsey Stone 66 y.o. female with medical history significant for atrial fibrillation, diverticulosis, hypertension, osteopenia, and vitamin D deficiency who presents for evaluation of pulmonary embolism in the setting of heterozygous factor V Leiden.  After review of the labs, review of the records, and discussion with the patient the patients findings are most consistent with a provoked pulmonary embolism in the setting of estrogen-based birth control use.  Patient also has heterozygous factor V Leiden which may have contributed to the blood clot in combination with this estrogen-based birth control  A provoked venous thromboembolism (VTE) is one that has a clear inciting factor or event. Provoking factors include prolonged travel/immobility, surgery (particular abdominal or orthropedic), trauma,  and pregnancy/ estrogen containing birth control. This patient was reported to  be on oral contraceptive pills containing estrogen, which would qualify as a transient provoking factor. As such we would recommend 3-6 months of anticoagulation therapy with consideration of additional therapy if symptoms persist. The anticoagulation therapy of choice in this situation is 3 to 6 months of Eliquis. The patient currently has a supply of this medication and is able to afford it without difficulty.  Of note the patient is currently asymptomatic after 3 months of treatment.  Therefore be reasonable to discontinue therapy.  We also discussed in detail the risks and benefits of  discontinuation of anticoagulation therapy.  The patient has heterozygous factor V Leiden which mildly increases her rate of VTE above that of the general population.  At this time I do believe her blood clot was provoked by combination of factors including both the factor V Leiden and estrogen-based birth control.  As such I would recommend that we discontinue all further estrogen-based therapies.  As such I think indefinite anticoagulation is not required in her situation.  The patient voiced understanding of the risk and benefits and was willing to discontinue Eliquis at this time.  #Provoked DVT/Pulmonary Embolism --findings at this time are consistent with a provoked VTE -- Given the patient is discontinuing DOAC therapy further labs are not required today. -- Okay to discontinue eliquis 5mg  BID  --patient denies any bleeding, bruising, or dark stools on this medication. It is well tolerated. No difficulties accessing/affording the medication --Recommend avoidance of estrogen-based therapies from here on forward.  --RTC on an as-needed basis.  Recommend the patient's daughter be tested for factor V Leiden as she is currently pregnant.  No orders of the defined types were placed in this encounter.   All questions were answered. The patient knows to call the clinic with any problems, questions or concerns.  A total of more than 60 minutes were spent on this encounter with face-to-face time and non-face-to-face time, including preparing to see the patient, ordering tests and/or medications, counseling the patient and coordination of care as outlined above.   Ledell Peoples, MD Department of Hematology/Oncology [place] [facility] Phone: [phone] Pager: [phone] Email: [email or web address]  02/11/2022 10:08 PM

## 2022-02-11 ENCOUNTER — Other Ambulatory Visit: Payer: Self-pay

## 2022-02-11 ENCOUNTER — Inpatient Hospital Stay: Payer: No Typology Code available for payment source

## 2022-02-11 ENCOUNTER — Inpatient Hospital Stay
Payer: No Typology Code available for payment source | Attending: Hematology and Oncology | Admitting: Hematology and Oncology

## 2022-02-11 VITALS — BP 140/67 | HR 75 | Temp 98.1°F | Resp 18 | Ht 63.0 in | Wt 137.4 lb

## 2022-02-11 DIAGNOSIS — I2699 Other pulmonary embolism without acute cor pulmonale: Secondary | ICD-10-CM

## 2022-02-11 DIAGNOSIS — I2694 Multiple subsegmental pulmonary emboli without acute cor pulmonale: Secondary | ICD-10-CM

## 2022-02-11 DIAGNOSIS — M8589 Other specified disorders of bone density and structure, multiple sites: Secondary | ICD-10-CM

## 2022-02-11 DIAGNOSIS — D6851 Activated protein C resistance: Secondary | ICD-10-CM

## 2022-02-11 DIAGNOSIS — M8580 Other specified disorders of bone density and structure, unspecified site: Secondary | ICD-10-CM | POA: Diagnosis not present

## 2022-02-11 DIAGNOSIS — E559 Vitamin D deficiency, unspecified: Secondary | ICD-10-CM

## 2022-02-11 DIAGNOSIS — I4891 Unspecified atrial fibrillation: Secondary | ICD-10-CM

## 2022-02-13 ENCOUNTER — Other Ambulatory Visit: Payer: Self-pay | Admitting: Cardiology

## 2022-02-13 DIAGNOSIS — R55 Syncope and collapse: Secondary | ICD-10-CM

## 2022-02-13 DIAGNOSIS — I10 Essential (primary) hypertension: Secondary | ICD-10-CM

## 2022-02-24 ENCOUNTER — Other Ambulatory Visit: Payer: Self-pay | Admitting: Cardiology

## 2022-02-24 DIAGNOSIS — R55 Syncope and collapse: Secondary | ICD-10-CM

## 2022-02-24 DIAGNOSIS — I10 Essential (primary) hypertension: Secondary | ICD-10-CM

## 2022-02-27 ENCOUNTER — Telehealth: Payer: Self-pay

## 2022-02-27 NOTE — Telephone Encounter (Addendum)
Spoke with pt to relay message below. Pt voiced understanding.  ----- Message from Jaci Standard, MD sent at 02/27/2022 12:52 PM EDT ----- Regarding: RE: Baby Aspirin There is no need for regular baby aspirin moving forward. She may want to consider taking one before a long flight or car ride, but otherwise does not need any daily aspirin.  ----- Message ----- From: Barrie Folk, RN Sent: 02/27/2022  11:55 AM EDT To: Jaci Standard, MD Subject: Baby Aspirin                                   Pt wants to know if she should be taking a baby aspirin?

## 2022-03-03 ENCOUNTER — Telehealth: Payer: Self-pay

## 2022-03-03 NOTE — Telephone Encounter (Signed)
Called asking for a medical clearance for a hip replacement. Informed them that pt needs an appt due that she has not been here in a year. Lindsey Stone will call the patient

## 2022-03-11 ENCOUNTER — Other Ambulatory Visit: Payer: Self-pay | Admitting: Cardiology

## 2022-03-11 DIAGNOSIS — I340 Nonrheumatic mitral (valve) insufficiency: Secondary | ICD-10-CM

## 2022-03-11 DIAGNOSIS — I341 Nonrheumatic mitral (valve) prolapse: Secondary | ICD-10-CM

## 2022-03-26 NOTE — Progress Notes (Signed)
Primary Physician/Referring:  Kathyrn Lass, MD  Patient ID: Lindsey Stone, female    DOB: 1955-10-05, 66 y.o.   MRN: [id number]  Chief Complaint  Patient presents with   Surgical Clearance   Follow-up    1 year    HPI:    Lindsey Stone  is a 66 y.o.  fairly active Caucasian female patient with mild hypertension, history of diverticular bleed requiring microcoil embolization in 2021, seen by me a year ago in July 2022 for visual disturbances, near syncopal episodes.  She was also found to have moderate MR due to MVP.  She is now referred back for preoperative cardiac work-up for left hip arthroplasty. In the interim in April 2023, she was diagnosed with right lower lobe pulmonary embolism without RV strain by CTA.  She was also diagnosed with Leiden 5 deficiency, was on anticoagulants for 3 months.  She was taken off of hormone replacement therapy.  On her last office visit a year ago, due to elevated blood pressure and also for palpitations, she was started on metoprolol and blood pressure has normalized.  She now presents here for routine visit, states that her blood pressure has been very low.   Past Medical History:  Diagnosis Date   Allergic rhinitis    Anal skin tag    Arthritis    Atrial fibrillation (HCC)    Blepharitis    Dermatitis, seborrheic    Diverticulosis    Hair loss    Hypertension    Osteopenia    Primary osteoarthritis of right hip    Vitamin D deficiency    Past Surgical History:  Procedure Laterality Date   BREAST BIOPSY Right 1983   BUNIONECTOMY Right 2010   HEMORRHOID BANDING  11/2011   medoff   HEMORRHOID SURGERY  2012   IR ANGIOGRAM SELECTIVE EACH ADDITIONAL VESSEL  11/13/2019   IR ANGIOGRAM VISCERAL SELECTIVE  11/13/2019   IR EMBO ART  VEN HEMORR LYMPH EXTRAV  INC GUIDE ROADMAPPING  11/13/2019   IR US GUIDE VASC ACCESS RIGHT  11/13/2019   right hand surgery     Right hip replaced     Family History  Problem Relation Age of Onset    Hypertension Mother    Hyperlipidemia Mother    Osteoporosis Mother    Memory loss Mother    Macular degeneration Mother    Hypertension Father    Hyperlipidemia Father    Deep vein thrombosis Father    Heart disease Father        Required pacemaker   Prostate cancer Father    Osteopenia Sister    Hypertension Sister    Other Sister        MVA    Social History   Tobacco Use   Smoking status: Former    Packs/day: 0.25    Years: 20.00    Total pack years: 5.00    Types: Cigarettes    Quit date: 12/04/2000    Years since quitting: 21.3   Smokeless tobacco: Never   Tobacco comments:    quit in 2003  Substance Use Topics   Alcohol use: Yes    Alcohol/week: 2.0 - 3.0 standard drinks of alcohol    Types: 2 - 3 Glasses of wine per week    Comment: per week   Marital Status: Married  ROS  Review of Systems  Cardiovascular:  Negative for chest pain, dyspnea on exertion and leg swelling.  Gastrointestinal:  Negative for melena.  Objective  Blood pressure 113/69, pulse 73, temperature 98.3 F (36.8 C), temperature source Temporal, resp. rate 16, height $RemoveBe'5\' 3"'TLGStwXGL$  (1.6 m), weight 137 lb 9.6 oz (62.4 kg), SpO2 96 %. Body mass index is 24.37 kg/m.     03/27/2022    9:36 AM 02/11/2022    9:11 AM 02/11/2021    2:28 PM  Vitals with BMI  Height $Remov'5\' 3"'eEmMGE$  $Remove'5\' 3"'SqjKFDc$  $RemoveB'5\' 3"'ulZkDjTs$   Weight 137 lbs 10 oz 137 lbs 6 oz 136 lbs 3 oz  BMI 24.38 32.91 91.66  Systolic 060 045 997  Diastolic 69 67 60  Pulse 73 75 71     Physical Exam HENT:     Head: Atraumatic.  Neck:     Vascular: No carotid bruit or JVD.  Cardiovascular:     Rate and Rhythm: Normal rate and regular rhythm.     Pulses: Intact distal pulses.     Heart sounds: S1 normal and S2 normal. Murmur heard.     Crescendo-decrescendo mid to late systolic murmur is present with a grade of 2/6 at the apex.     No gallop.  Pulmonary:     Effort: Pulmonary effort is normal.     Breath sounds: Normal breath sounds.  Abdominal:     General: Bowel  sounds are normal.     Palpations: Abdomen is soft.     Laboratory examination:   External labs:   Labs 10/24/2021:  Hb 10.9/HCT 32.0, platelets 216.  Sodium 139, potassium 3.8, BUN 8, creatinine 0.63, EGFR >60 mL.  LFTs normal.  Cholesterol, total 190.000 m 07/09/2021 HDL 67.000 mg 07/09/2021 LDL 108.000 m 07/09/2021 Triglycerides 82.000 mg 07/09/2021  A1C 5.400 % 11/04/2020 TSH 4.630 09/30/2021  Hemoglobin 12.200 g/d 10/20/2021  Creatinine, Serum 0.670 mg/ 10/20/2021 Potassium 4.500 mm 10/20/2021 ALT (SGPT) 27.000 U/L 10/20/2021  Labs 08/01/2020:  Hb 14.1/HCT 41.1, platelets 194, normal indicis.  Serum glucose 99 mg, BUN 12, creatinine 0.75, potassium 4.5.  CMP otherwise normal.  Vitamin D 24.2  Total cholesterol 170, triglycerides 106, HDL 59, LDL 92.  Non-HDL cholesterol 106.  Medications and allergies  No Known Allergies   FINAL MEDICATION AS OF TODAY:   Current Outpatient Medications:    Ascorbic Acid (VITAMIN C) 100 MG tablet, Take 100 mg by mouth daily., Disp: , Rfl:    Cholecalciferol (VITAMIN D3) 125 MCG (5000 UT) CAPS, Take by mouth., Disp: , Rfl:    Magnesium 250 MG TABS, Take 1 tablet by mouth daily., Disp: , Rfl:    Radiology:   CTA chest 10/23/2021: 1. Segmental RIGHT lower lobe pulmonary embolism with associated pulmonary infarction. Possible subsegmental pulmonary emboli at the LEFT lung base. No evidence of RIGHT heart strain. Small RIGHT pleural effusion    Cardiac Studies:   Carotid artery duplex 01/30/2021: Duplex suggests stenosis in the right internal carotid artery (1-15%). Duplex suggests stenosis in the left internal carotid artery (1-15%). Minimal diffuse plaque noted in bilateral carotid arteries. Antegrade right vertebral artery flow. Antegrade left vertebral artery flow.  PCV ECHOCARDIOGRAM COMPLETE 30-Jan-2021 Left ventricle cavity is normal in size. Mild concentric hypertrophy of the left ventricle. Normal LV systolic function with EF 55%.  Normal global wall motion. Diastolic function not assessed due to severity of mitral regurgitation. Left atrial cavity is mildly dilated. Mild prolapse of the mitral valve leaflets. Moderate (Grade III), posteriorly directed mitral regurgitation. Mild to moderate tricuspid regurgitation. Estimated pulmonary artery systolic pressure 33 mmHg.  Echocardiogram 10/23/2021 at [facility]: 1.  Left ventricle:  The cavity size is normal. Systolic function is normal. The estimated ejection fraction is 60-65%. Wall motion is normal; there are no regional wall motion abnormalities.  2.  Mitral valve: The valve leaflets are moderately thickened. There is moderate 2-3+ regurgitation. The posterior leaflet is restricted with posteriorly directed mitral regurgitation.  3.  Right ventricle: The cavity size is normal. Systolic function is normal.  Compared to our office study on 01/01/2021, prolapse of the mitral leaflets with moderate to severe posteriorly directed MR. Mild to moderate TR.  EKG:   EKG 03/27/2022: Normal sinus rhythm with rate of 64 bpm.  No significant change from 12/13/2020.   Assessment     ICD-10-CM   1. Mitral valve prolapse  I34.1 EKG 12-Lead    2. Moderate mitral regurgitation  I34.0     3. Elevated BP without diagnosis of hypertension  R03.0       Medications Discontinued During This Encounter  Medication Reason   apixaban (ELIQUIS) 2.5 MG TABS tablet    metoprolol succinate (TOPROL-XL) 25 MG 24 hr tablet Discontinued by provider    No orders of the defined types were placed in this encounter.  Orders Placed This Encounter  Procedures   EKG 12-Lead   Recommendations:   Lindsey Stone is a 66 y.o.  fairly active Caucasian female patient with mild hypertension, history of diverticular bleed requiring microcoil embolization in 2021, seen by me a year ago in July 2022 for visual disturbances, near syncopal episodes.  She was also found to have moderate MR due to MVP.  She is now  referred back for preoperative cardiac work-up for left hip arthroplasty. In the interim in April 2023, she was diagnosed with right lower lobe pulmonary embolism without RV strain by CTA.  She was also diagnosed with Leiden 5 deficiency, was on anticoagulants for 3 months.  She was taken off of hormone replacement therapy.  On her last office visit a year ago, due to elevated blood pressure and also for palpitations, she was started on metoprolol and blood pressure has normalized.  She now presents here for routine visit, states that her blood pressure has been very low, hence I will discontinue metoprolol for now and she will continue to monitor this and she will restart medication if blood pressure is >130/80 mmHg.  Physical examination is unremarkable except for a midsystolic click, I do not suspect mitral regurgitation has gotten any worse.  She has been scheduled for left hip arthroplasty, I do not see any contraindication from cardiac standpoint for her to have surgery with low risk.  I will see her back in a year for follow-up of mitral valve prolapse and moderate mitral regurgitation.  I may consider repeating her echocardiogram, depending upon her physical exam at that time.  No endocarditis prophylaxis is indicated.  I suspect her blood pressure has normalized as she is not on hormone replacement therapy.    Adrian Prows, MD, [facility] 03/27/2022, 10:06 AM Office: [phone]

## 2022-03-27 ENCOUNTER — Encounter: Payer: Self-pay | Admitting: Cardiology

## 2022-03-27 ENCOUNTER — Ambulatory Visit: Payer: No Typology Code available for payment source | Admitting: Cardiology

## 2022-03-27 VITALS — BP 113/69 | HR 73 | Temp 98.3°F | Resp 16 | Ht 63.0 in | Wt 137.6 lb

## 2022-03-27 DIAGNOSIS — I341 Nonrheumatic mitral (valve) prolapse: Secondary | ICD-10-CM

## 2022-03-27 DIAGNOSIS — I340 Nonrheumatic mitral (valve) insufficiency: Secondary | ICD-10-CM

## 2022-03-27 DIAGNOSIS — R030 Elevated blood-pressure reading, without diagnosis of hypertension: Secondary | ICD-10-CM

## 2022-03-27 DIAGNOSIS — I10 Essential (primary) hypertension: Secondary | ICD-10-CM

## 2022-03-31 ENCOUNTER — Encounter: Payer: Self-pay | Admitting: Cardiology

## 2022-04-13 ENCOUNTER — Ambulatory Visit (INDEPENDENT_AMBULATORY_CARE_PROVIDER_SITE_OTHER): Payer: Medicare Other | Admitting: Physician Assistant

## 2022-04-13 ENCOUNTER — Encounter: Payer: Self-pay | Admitting: Physician Assistant

## 2022-04-13 VITALS — BP 118/76 | HR 70 | Ht 63.0 in | Wt 139.0 lb

## 2022-04-13 DIAGNOSIS — K625 Hemorrhage of anus and rectum: Secondary | ICD-10-CM

## 2022-04-13 MED ORDER — HYDROCORTISONE 2.5 % EX OINT: TOPICAL_OINTMENT | Freq: Two times a day (BID) | CUTANEOUS | 0 refills | Status: DC

## 2022-04-13 NOTE — Progress Notes (Signed)
____________________________________________________________  Attending physician addendum:  Thank you for sending this case to me. I have reviewed the entire note and agree with the plan.  If you hear that she has a recurrence of this problem, please arrange an office appointment for her to see me and I would also do an anoscopy.  Wilfrid Lund, MD  ____________________________________________________________

## 2022-04-13 NOTE — Patient Instructions (Signed)
We have sent the following medications to your pharmacy for you to pick up at your convenience: Hydrocortisone Ointment- use twice daily for 7-14 days as needed for rectal bleeding.    _______________________________________________________  If you are age 65 or older, your body mass index should be between 23-30. Your Body mass index is 24.62 kg/m. If this is out of the aforementioned range listed, please consider follow up with your Primary Care Provider.  If you are age 77 or younger, your body mass index should be between 19-25. Your Body mass index is 24.62 kg/m. If this is out of the aformentioned range listed, please consider follow up with your Primary Care Provider.   ________________________________________________________  The [facility] providers would like to encourage you to use [facility] to communicate with providers for non-urgent requests or questions.  Due to long hold times on the telephone, sending your provider a message by [facility] may be a faster and more efficient way to get a response.  Please allow 48 business hours for a response.  Please remember that this is for non-urgent requests.  _______________________________________________________  Thank you for choosing me and [facility].  Ellouise Newer PA

## 2022-04-13 NOTE — Progress Notes (Signed)
Chief Complaint: Rectal bleeding  HPI:    Lindsey Stone is a 66 year old female with a past medical history as listed below including A-fib, known to Dr. Myrtie Neither, who was referred to me by Sigmund Hazel, MD for a complaint of bleeding.    02/11/2021 patient seen in clinic for perianal pain.  That time discussed she had been hospitalized June 2021 for large volume diverticular bleeding with hypotension controlled with interventional radiology coil embolization.  Last colonoscopy noted in 2018.  Possible hemorrhoid banding in the past.  Also saw Dr. 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  At that time having perianal pain for 2 weeks with swelling and no bleeding.  Reminiscent of previous hemorrhoids.  Symptoms had subsided.  Discussed that she had recurrent problems refer back to Dr. Fara Boros prior colorectal surgery for eval.    December 2022 patient called and describes some episodes of bleeding on the toilet paper.    10/24/2021 CBC with a hemoglobin of 10.9.    Today, the patient tells me that she had 3 days of some rectal bleeding which started 04/08/2022, she would only see it with her first bowel in the day and she typically has 3 to 4/day.  it was a little bit of bright red blood mixed in the stool and on the toilet paper.  She saw this again on Thursday and Friday and then has not seen any further over the past 3 days.  Denies any straining or diarrhea.  She just got a little worried because she is supposed to go out of town next week and then is having surgery for her hip on 04/28/2022 and wanted to make sure everything was okay.    Denies fever, chills, weight loss, change in bowel habits or abdominal pain.  Past Medical History:  Diagnosis Date   Allergic rhinitis    Anal skin tag    Arthritis    Atrial fibrillation (HCC)    Blepharitis    Dermatitis, seborrheic    Diverticulosis    Hair loss    Hypertension    Osteopenia    Primary osteoarthritis of  right hip    Vitamin D deficiency     Past Surgical History:  Procedure Laterality Date   BREAST BIOPSY Right 1983   BUNIONECTOMY Right 2010   HEMORRHOID BANDING  11/2011   [facility]   HEMORRHOID SURGERY  2012   IR ANGIOGRAM SELECTIVE EACH ADDITIONAL VESSEL  11/13/2019   IR ANGIOGRAM VISCERAL SELECTIVE  11/13/2019   IR EMBO ART  VEN HEMORR LYMPH EXTRAV  INC GUIDE ROADMAPPING  11/13/2019   IR US GUIDE VASC ACCESS RIGHT  11/13/2019   right hand surgery     Right hip replaced      Current Outpatient Medications  Medication Sig Dispense Refill   Ascorbic Acid (VITAMIN C) 100 MG tablet Take 100 mg by mouth daily.     Cholecalciferol (VITAMIN D3) 125 MCG (5000 UT) CAPS Take by mouth.     Magnesium 250 MG TABS Take 1 tablet by mouth daily.     No current facility-administered medications for this visit.    Allergies as of 04/13/2022   (No Known Allergies)    Family History  Problem Relation Age of Onset   Hypertension Mother    Hyperlipidemia Mother    Osteoporosis Mother    Memory loss Mother    Macular degeneration Mother    Hypertension Father    Hyperlipidemia Father  Deep vein thrombosis Father    Heart disease Father        Required pacemaker   Prostate cancer Father    Osteopenia Sister    Hypertension Sister    Other Sister        MVA    Social History   Socioeconomic History   Marital status: Married    Spouse name: Not on file   Number of children: 2   Years of education: Not on file   Highest education level: Not on file  Occupational History   Not on file  Tobacco Use   Smoking status: Former    Packs/day: 0.25    Years: 20.00    Total pack years: 5.00    Types: Cigarettes    Quit date: 12/04/2000    Years since quitting: 21.3   Smokeless tobacco: Never   Tobacco comments:    quit in 2003  Vaping Use   Vaping Use: Never used  Substance and Sexual Activity   Alcohol use: Yes    Alcohol/week: 2.0 - 3.0 standard drinks of alcohol    Types: 2 - 3  Glasses of wine per week    Comment: per week   Drug use: Never   Sexual activity: Yes  Other Topics Concern   Not on file  Social History Narrative   Lives with husband   Caffeine- 2- 3 cups per day    Right handed    Social Determinants of Health   Financial Resource Strain: Not on file  Food Insecurity: Not on file  Transportation Needs: Not on file  Physical Activity: Not on file  Stress: Not on file  Social Connections: Not on file  Intimate Partner Violence: Not on file    Review of Systems:    Constitutional: No weight loss, fever or chills Cardiovascular: No chest pain Respiratory: No SOB  Gastrointestinal: See HPI and otherwise negative   Physical Exam:  Vital signs: BP 118/76   Pulse 70   Ht 5\' 3"  (1.6 m)   Wt 139 lb (63 kg)   BMI 24.62 kg/m    Constitutional:   Pleasant Caucasian female appears to be in NAD, Well developed, Well nourished, alert and cooperative Respiratory: Respirations even and unlabored. Lungs clear to auscultation bilaterally.   No wheezes, crackles, or rhonchi.  Cardiovascular: Normal S1, S2. No MRG. Regular rate and rhythm. No peripheral edema, cyanosis or pallor.  Gastrointestinal:  Soft, nondistended, nontender. No rebound or guarding. Normal bowel sounds. No appreciable masses or hepatomegaly. Rectal: External: Hemorrhoid tag with some irritation (possibly prolapsed from the internal region); internal: No visible hemorrhoids or fissure; anoscopy: No blood, fissure or hemorrhoids Psychiatric: Demonstrates good judgement and reason without abnormal affect or behaviors.  No recent labs or imaging.  Assessment: 1.  Rectal bleeding: For 3 days, prior hemorrhoid surgery for external hemorrhoids as well as banding and history of diverticular bleeding, this seems like a small amount of blood and was only with the first bowel movement of the day, typically has 3-4, no rectal pain, internal hemorrhoid tag seen at time of exam, no bleeding;  consider bleeding most likely from this tag/irritation versus possibly hemorrhoids which is better now?  Plan: 1.  Prescribed Hydrocortisone ointment 2.5% to be applied twice daily as needed for 1 to 2 weeks for any bleeding in the future. 2.  Discussed alarm features with the patient, if she has any rectal pain or the bleeding becomes increased in frequency then she should call  and let us know. 3.  Patient to follow in clinic with [place] as needed.  Hyacinth Meeker, PA-C [facility] 04/13/2022, 3:13 PM  Cc: Sigmund Hazel, MD

## 2022-05-06 ENCOUNTER — Other Ambulatory Visit: Payer: Self-pay | Admitting: Family Medicine

## 2022-05-06 DIAGNOSIS — R9389 Abnormal findings on diagnostic imaging of other specified body structures: Secondary | ICD-10-CM

## 2022-05-22 ENCOUNTER — Ambulatory Visit
Admission: RE | Admit: 2022-05-22 | Discharge: 2022-05-22 | Disposition: A | Discharge status: Home / Self Care | Payer: Medicare Other | Source: Ambulatory Visit | Attending: Family Medicine | Admitting: Family Medicine

## 2022-05-22 DIAGNOSIS — R9389 Abnormal findings on diagnostic imaging of other specified body structures: Secondary | ICD-10-CM

## 2022-06-12 ENCOUNTER — Other Ambulatory Visit: Payer: No Typology Code available for payment source

## 2022-07-09 ENCOUNTER — Other Ambulatory Visit: Payer: Self-pay | Admitting: Family Medicine

## 2022-07-09 DIAGNOSIS — R9389 Abnormal findings on diagnostic imaging of other specified body structures: Secondary | ICD-10-CM

## 2022-08-21 IMAGING — US US ABDOMEN LIMITED
1 series · 14 of 25 positions shown · non-contrast
Comparison: February 23, 2020.

CLINICAL DATA: Acute right upper quadrant abdominal pain.

EXAM:
ULTRASOUND ABDOMEN LIMITED RIGHT UPPER QUADRANT

[Series 1: us abdomen limited · 0.19mm/px · 55 acquisitions, 14 frames shown]
[im 1/55]
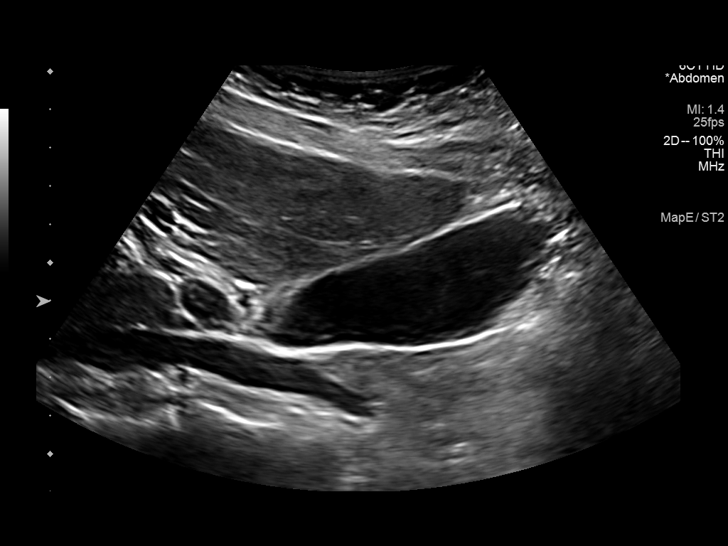
[im 5/55]
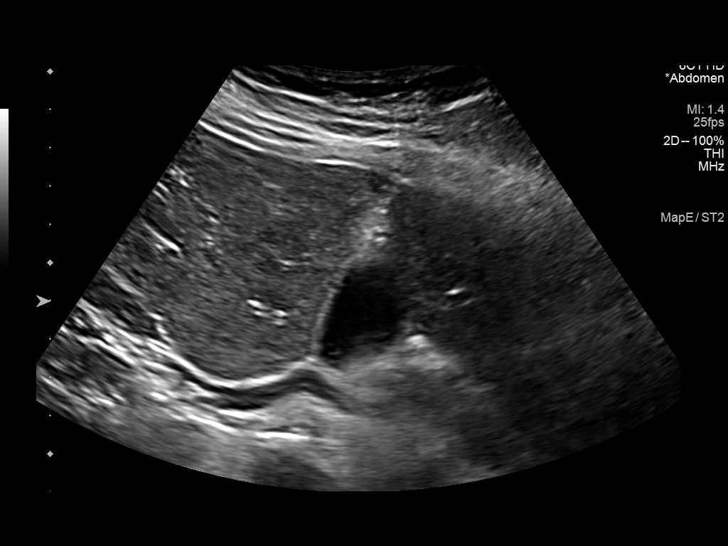
[im 10/55]
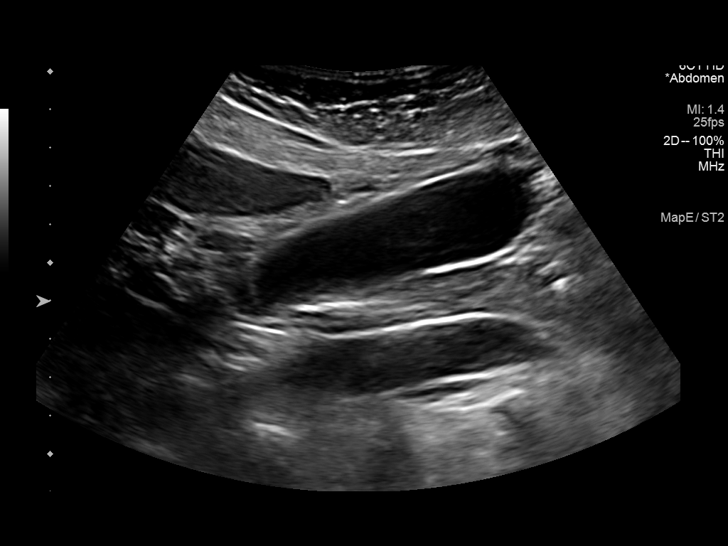
[im 14/55]
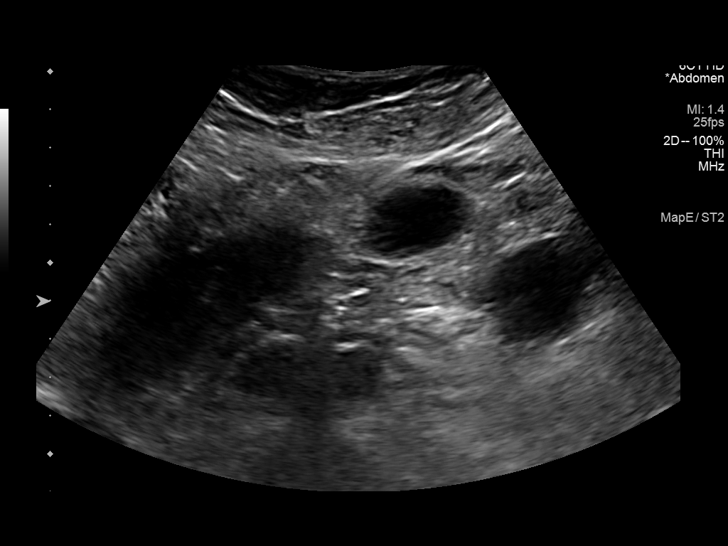
[im 19/55]
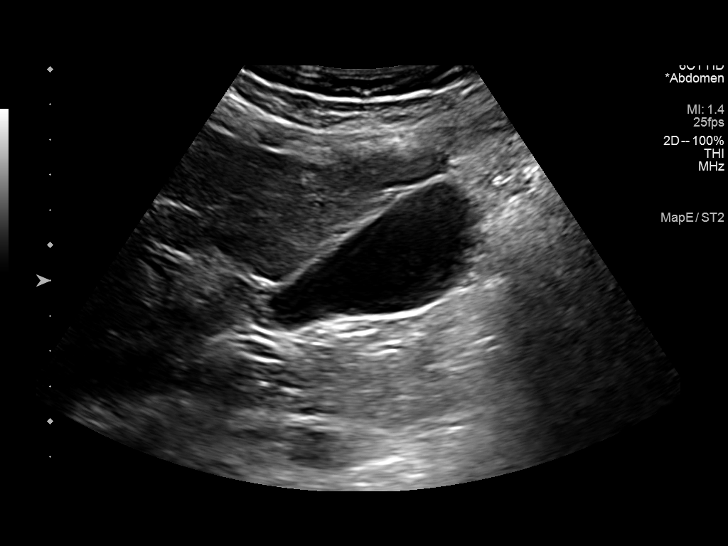
[im 21/55]
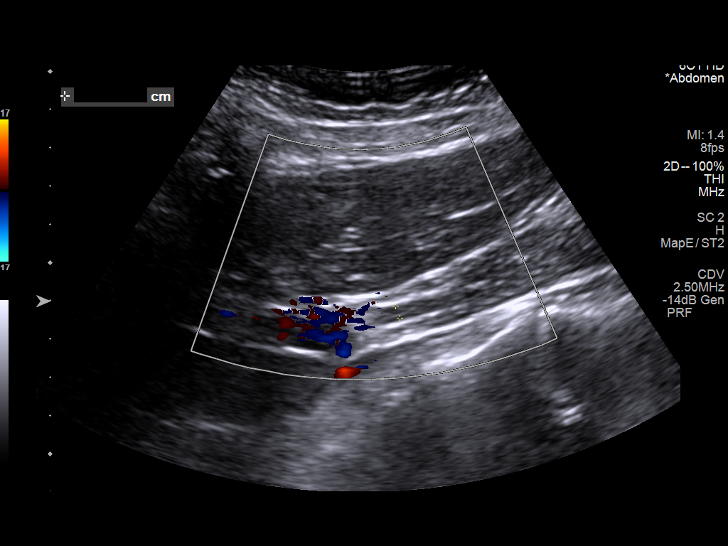
[im 25/55]
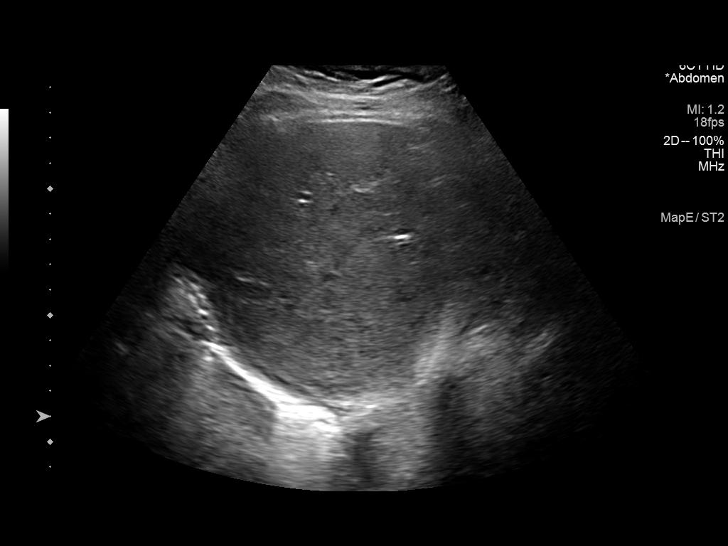
[im 30/55]
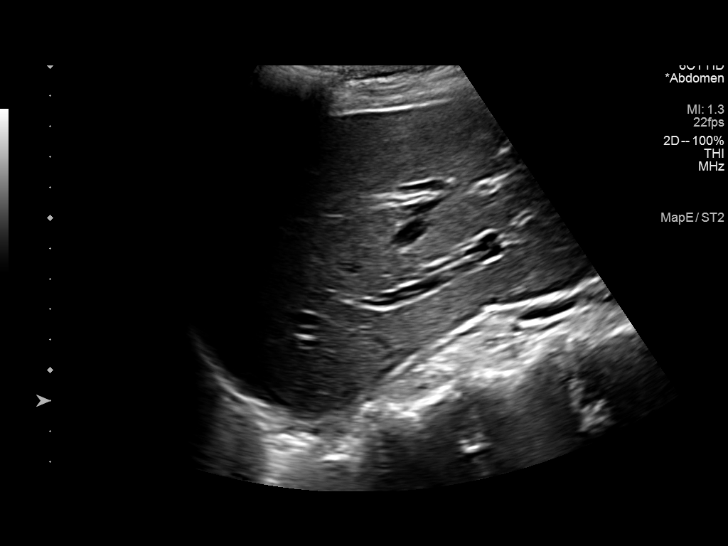
[im 34/55]
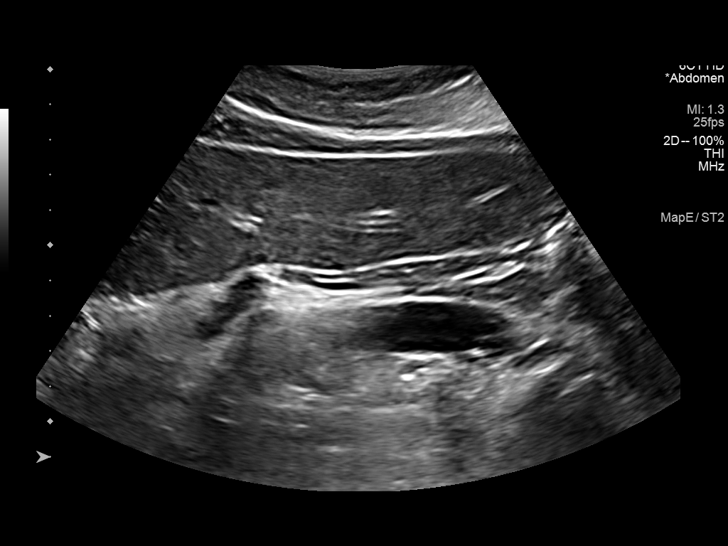
[im 37/55]
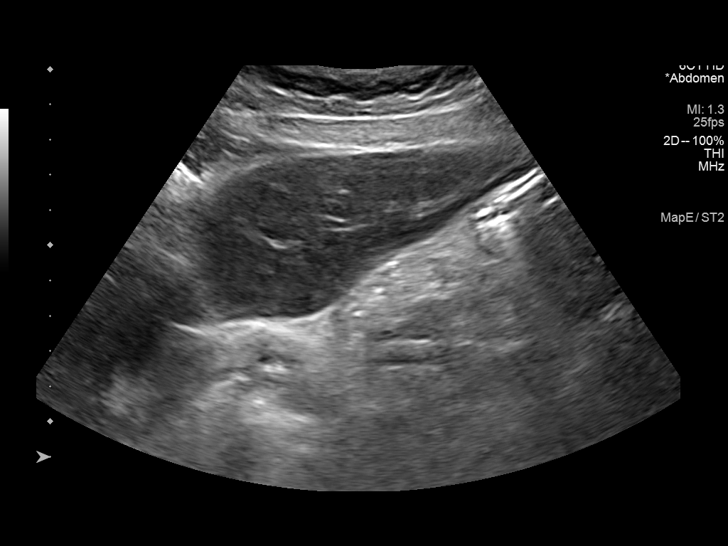
[im 41/55]
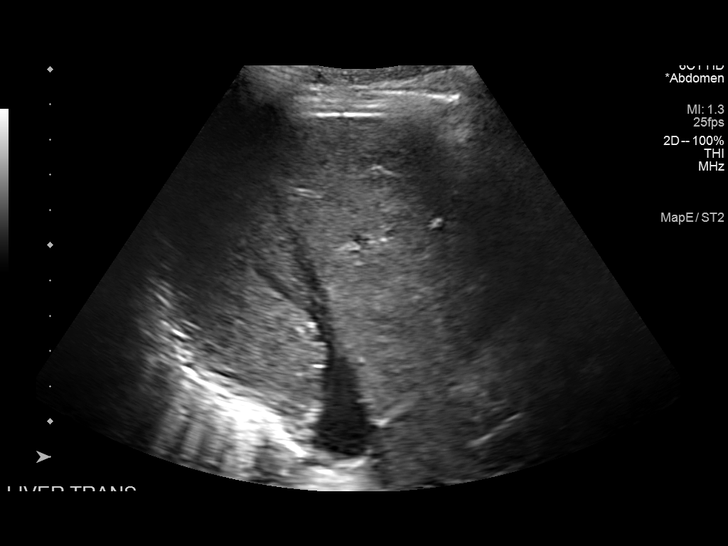
[im 46/55]
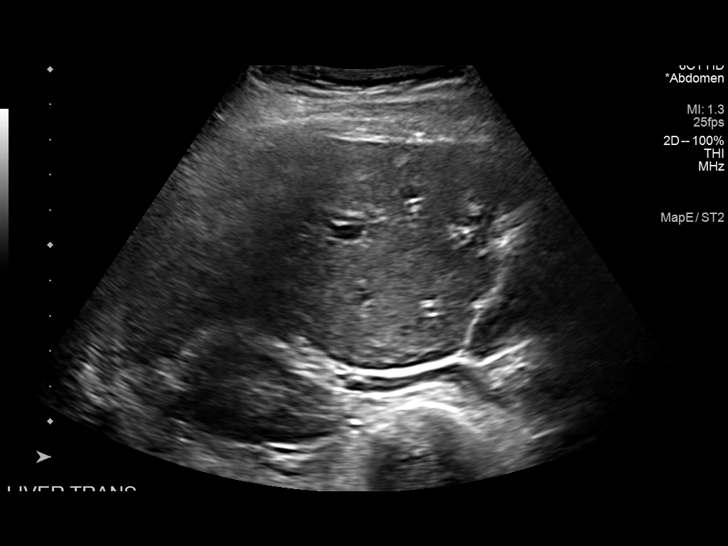
[im 50/55]
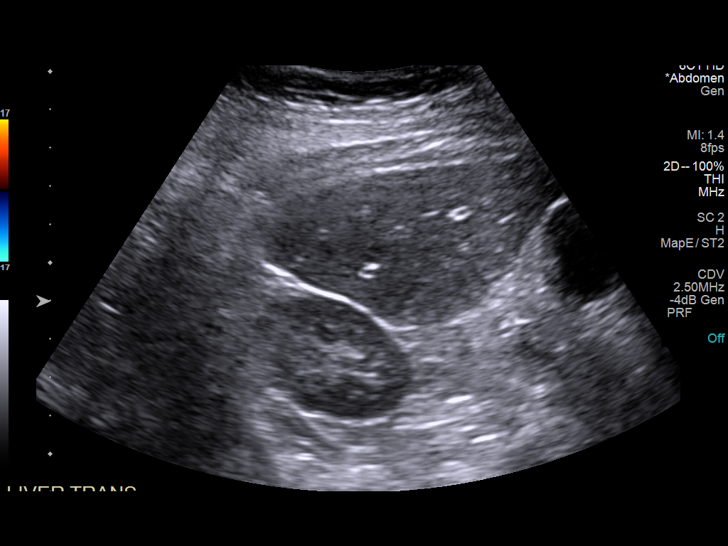
[im 55/55]
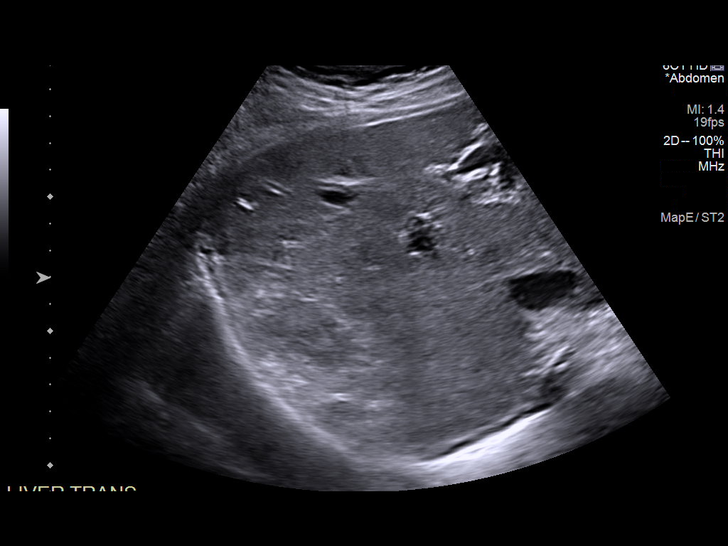

[14 of 25 positions shown; findings below may reference images not displayed]

FINDINGS: Gallbladder:

No gallstones or wall thickening visualized. No sonographic Murphy
sign noted by sonographer.

Common bile duct:

Diameter: 3 mm which is within normal limits.

Liver:

No focal lesion identified. Within normal limits in parenchymal
echogenicity. Portal vein is patent on color Doppler imaging with
normal direction of blood flow towards the liver.

Other: None.
IMPRESSION: No definite abnormality seen in the right upper quadrant of the
abdomen.

## 2022-09-15 ENCOUNTER — Telehealth: Payer: Self-pay | Admitting: Physician Assistant

## 2022-09-15 NOTE — Telephone Encounter (Signed)
Inbound call from patient stating that she wanted to discuss scheduling a colonoscopy, patient has a recall in for 2028. Please advise.

## 2022-09-16 NOTE — Telephone Encounter (Signed)
Returned call to patient. I left patient a detailed vm letting her know that her recall is not due until 2028. I informed patient that if she is having any GI symptoms she will need an office visit prior to scheduling a colonoscopy sooner than her recall date. I also advised patient to call back if she has an update to her family history (ex. Family history of colon cancer). Otherwise, if having GI symptoms patient will need office visit.

## 2022-09-16 NOTE — Telephone Encounter (Signed)
Returned call to patient. After reviewing last office note from 04/2022 Dr. Loletha Carrow stated that if patient has a recurrence of symptoms he would like to see her in the office to do anoscopy. Pt states that there is "no rhyme or reason, symptoms come and go". Hydrocortisone cream did not help. Pt thinks something more than hemorrhoids is going on. I explained this to patient the need for office visit and she is agreeable to schedule appt if necessary. Pt has been scheduled for a f/u with Dr. Loletha Carrow on 09/23/22 at 11 am. Pt understands that at that point Dr. Loletha Carrow will determine if colonoscopy is needed. Pt verbalized understanding and had no concerns at the end of the call.

## 2022-09-16 NOTE — Telephone Encounter (Signed)
Patient is returning your called requesting to speak with you .Marland Kitchen She said she still have questions of why she will need an OV when she had one not to long ago with"Jennifer lemmon" and she still have same rectal bleeding problem .Please advise

## 2022-09-23 ENCOUNTER — Encounter: Payer: Self-pay | Admitting: Gastroenterology

## 2022-09-23 ENCOUNTER — Ambulatory Visit (INDEPENDENT_AMBULATORY_CARE_PROVIDER_SITE_OTHER): Payer: Medicare Other | Admitting: Gastroenterology

## 2022-09-23 VITALS — BP 110/60 | HR 76 | Ht 63.0 in | Wt 140.0 lb

## 2022-09-23 DIAGNOSIS — K625 Hemorrhage of anus and rectum: Secondary | ICD-10-CM | POA: Diagnosis not present

## 2022-09-23 MED ORDER — NA SULFATE-K SULFATE-MG SULF 17.5-3.13-1.6 GM/177ML PO SOLN: 1.0000 | Freq: Once | ORAL | 0 refills | Status: AC

## 2022-09-23 NOTE — Patient Instructions (Signed)
_______________________________________________________  If your blood pressure at your visit was 140/90 or greater, please contact your primary care physician to follow up on this.  _______________________________________________________  If you are age 66 or older, your body mass index should be between 23-30. Your Body mass index is 24.8 kg/m. If this is out of the aforementioned range listed, please consider follow up with your Primary Care Provider.  If you are age 55 or younger, your body mass index should be between 19-25. Your Body mass index is 24.8 kg/m. If this is out of the aformentioned range listed, please consider follow up with your Primary Care Provider.   ________________________________________________________  The [facility] providers would like to encourage you to use [facility] to communicate with providers for non-urgent requests or questions.  Due to long hold times on the telephone, sending your provider a message by [facility] may be a faster and more efficient way to get a response.  Please allow 48 business hours for a response.  Please remember that this is for non-urgent requests.  _______________________________________________________  Lindsey Stone have been scheduled for a colonoscopy. Please follow written instructions given to you at your visit today.  Please pick up your prep supplies at the pharmacy within the next 1-3 days. If you use inhalers (even only as needed), please bring them with you on the day of your procedure.  It was a pleasure to see you today!  Thank you for trusting me with your gastrointestinal care!

## 2022-09-23 NOTE — Progress Notes (Signed)
     [facility] Progress Note  Chief Complaint: Rectal bleeding  Subjective  History: Recurrent rectal bleeding-last office visit with APP October 2023.  No reported hemorrhoids on anoscopy.  Previous records obtained from primary care regarding prior colonoscopies with Dr. 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 Drue Flirt of colorectal surgery.  Lindsey Stone is also known to me from hospitalization in May 2021 when she had a hemodynamically significant diverticular bleed treated with coil embolization on angiogram. __________________________________  Episodic painless rectal bleeding continues.  BM's typical for her - few a day, sometimes semi-formed.  Eats a lot of fiber, occ'l bloating.  Concerned re: Hx diverticular bleed and hopes this is not from that.  ROS: Cardiovascular:  no chest pain Respiratory: no dyspnea  The patient's Past Medical, Family and Social History were reviewed and are on file in the EMR. Past Medical History:  Diagnosis Date   Allergic rhinitis    Anal skin tag    Arthritis    Atrial fibrillation (HCC)    Blepharitis    Bunion    Dermatitis, seborrheic    Diverticulosis    Hair loss    Hammer toe    Hypertension    Menopause    Osteopenia    Primary osteoarthritis of right hip    Pulmonary emboli (HCC)    Pulmonary hypertension (HCC)    Tenosynovitis, wrist    Ulnar neuropathy    Vitamin D deficiency     Objective:  Med list reviewed  Current Outpatient Medications:    Ascorbic Acid (VITAMIN C) 100 MG tablet, Take 100 mg by mouth daily., Disp: , Rfl:    Cholecalciferol (VITAMIN D3) 125 MCG (5000 UT) CAPS, Take by mouth., Disp: , Rfl:    Fluocinolone Acetonide 0.01 % OIL, 5 DROPS INTO  AFFECTED EAR OTIC TWICE A DAY, Disp: , Rfl:    Magnesium 250 MG TABS, Take 1 tablet by mouth daily., Disp: , Rfl:    Vital signs in last 24 hrs: Vitals:   09/23/22 1056  BP: 110/60  Pulse: 76   Wt Readings from Last 3 Encounters:  09/23/22 140 lb (63.5 kg)  04/13/22 139 lb (63 kg)  03/27/22 137 lb 9.6 oz (62.4 kg)    Physical Exam  Well-appearing Cardiac: regular without murmur,  no peripheral edema Pulm: clear to auscultation bilaterally, normal RR and effort noted Abdomen: soft, no tenderness, with active bowel sounds.  Labs:   ___________________________________________ Radiologic studies:   ____________________________________________ Other:   _____________________________________________ Assessment & Plan  Assessment: Encounter Diagnosis  Name Primary?   Rectal bleeding Yes   Sounds ano-rectal and not diverticular. Reports prior HR banding by [facility], no HR reported on 2018 colon report. Had anal condyloma removed years ago, doubtful related to that.   Plan: Colonoscopy.  The benefits and risks of the planned procedure were described in detail with the patient or (when appropriate) their health care proxy.  Risks were outlined as including, but not limited to, bleeding, infection, perforation, adverse medication reaction leading to cardiac or pulmonary decompensation, pancreatitis (if ERCP).  The limitation of incomplete mucosal visualization was also discussed.  No guarantees or warranties were given.   Nelida Meuse III

## 2022-11-03 ENCOUNTER — Ambulatory Visit (AMBULATORY_SURGERY_CENTER): Payer: Medicare Other | Admitting: Gastroenterology

## 2022-11-03 ENCOUNTER — Encounter: Payer: Self-pay | Admitting: Gastroenterology

## 2022-11-03 VITALS — BP 116/74 | HR 82 | Temp 98.0°F | Resp 26 | Ht 63.0 in | Wt 140.0 lb

## 2022-11-03 DIAGNOSIS — K625 Hemorrhage of anus and rectum: Secondary | ICD-10-CM

## 2022-11-03 MED ORDER — SODIUM CHLORIDE 0.9 % IV SOLN: 500.0000 mL | Freq: Once | INTRAVENOUS | Status: DC

## 2022-11-03 NOTE — Progress Notes (Signed)
VS completed by DT.  Pt's states no medical or surgical changes since previsit or office visit.  

## 2022-11-03 NOTE — Progress Notes (Signed)
Sedate, gd SR, tolerated procedure well, VSS, report to RN 

## 2022-11-03 NOTE — Patient Instructions (Addendum)
Resume previous diet. Continue present medications. Repeat colonoscopy in 10 years for screening purposes.   YOU HAD AN ENDOSCOPIC PROCEDURE TODAY AT THE [facility]:   Refer to the procedure report that was given to you for any specific questions about what was found during the examination.  If the procedure report does not answer your questions, please call your gastroenterologist to clarify.  If you requested that your care partner not be given the details of your procedure findings, then the procedure report has been included in a sealed envelope for you to review at your convenience later.  YOU SHOULD EXPECT: Some feelings of bloating in the abdomen. Passage of more gas than usual.  Walking can help get rid of the air that was put into your GI tract during the procedure and reduce the bloating. If you had a lower endoscopy (such as a colonoscopy or flexible sigmoidoscopy) you may notice spotting of blood in your stool or on the toilet paper. If you underwent a bowel prep for your procedure, you may not have a normal bowel movement for a few days.  Please Note:  You might notice some irritation and congestion in your nose or some drainage.  This is from the oxygen used during your procedure.  There is no need for concern and it should clear up in a day or so.  SYMPTOMS TO REPORT IMMEDIATELY:  Following lower endoscopy (colonoscopy or flexible sigmoidoscopy):  Excessive amounts of blood in the stool  Significant tenderness or worsening of abdominal pains  Swelling of the abdomen that is new, acute  Fever of 100F or higher  For urgent or emergent issues, a gastroenterologist can be reached at any hour by calling [phone]. Do not use MyChart messaging for urgent concerns.    DIET:  We do recommend a small meal at first, but then you may proceed to your regular diet.  Drink plenty of fluids but you should avoid alcoholic beverages for 24 hours.  ACTIVITY:  You should plan  to take it easy for the rest of today and you should NOT DRIVE or use heavy machinery until tomorrow (because of the sedation medicines used during the test).    FOLLOW UP: Our staff will call the number listed on your records the next business day following your procedure.  We will call around 7:15- 8:00 am to check on you and address any questions or concerns that you may have regarding the information given to you following your procedure. If we do not reach you, we will leave a message.     If any biopsies were taken you will be contacted by phone or by letter within the next 1-3 weeks.  Please call us at [phone] if you have not heard about the biopsies in 3 weeks.    SIGNATURES/CONFIDENTIALITY: You and/or your care partner have signed paperwork which will be entered into your electronic medical record.  These signatures attest to the fact that that the information above on your After Visit Summary has been reviewed and is understood.  Full responsibility of the confidentiality of this discharge information lies with you and/or your care-partner. 

## 2022-11-03 NOTE — Op Note (Signed)
[facility] Patient Name: Lindsey Stone Procedure Date: 11/03/2022 8:27 AM MRN: [id number] Endoscopist: Sherilyn Cooter L. Myrtie Neither , MD, [id number] Age: 67 Referring MD:  Date of Birth: May 06, 1956 Gender: Female Account #: [id number] Procedure:                Colonoscopy Indications:              Rectal bleeding                           See recent office consult note for details. No                            polyps last colonoscopy 2018 (Dr. Kinnie Scales)                           Prior surgical removal anal condyloma (Dr. Drue Dun) Medicines:                Monitored Anesthesia Care Procedure:                Pre-Anesthesia Assessment:                           - Prior to the procedure, a History and Physical                            was performed, and patient medications and                            allergies were reviewed. The patient's tolerance of                            previous anesthesia was also reviewed. The risks                            and benefits of the procedure and the sedation                            options and risks were discussed with the patient.                            All questions were answered, and informed consent                            was obtained. Prior Anticoagulants: The patient has                            taken no anticoagulant or antiplatelet agents. ASA                            Grade Assessment: II - A patient with mild systemic                            disease. After reviewing the risks and benefits,  the patient was deemed in satisfactory condition to                            undergo the procedure.                           After obtaining informed consent, the colonoscope                            was passed under direct vision. Throughout the                            procedure, the patient's blood pressure, pulse, and                            oxygen saturations were monitored continuously. The                             Olympus CF-HQ190L ([id number]) Colonoscope was                            introduced through the anus and advanced to the the                            cecum, identified by appendiceal orifice and                            ileocecal valve. The colonoscopy was somewhat                            difficult due to multiple diverticula in the colon.                            The patient tolerated the procedure well. The                            quality of the bowel preparation was excellent. The                            ileocecal valve, appendiceal orifice, and rectum                            were photographed. Scope In: 8:40:26 AM Scope Out: 8:59:40 AM Scope Withdrawal Time: 0 hours 13 minutes 31 seconds  Total Procedure Duration: 0 hours 19 minutes 14 seconds  Findings:                 The digital rectal exam findings include A palpable                            posterior hypertrophied anal papilla. There is also                            visible and palpable postsurgical fibrotic tissue  at the distal extent of the posterior anal canal.                            No stricture.                           Repeat examination of right colon under NBI                            performed.                           Diverticula were found in the entire colon.                           Anal papilla(e) were hypertrophied (posterior).                           The exam was otherwise without abnormality on                            direct and retroflexion views. No internal                            hemorrhoids were seen. There was a rectal mucosal                            scar 1 to 2 cm proximal to the anal verge.                            (probable prior banding) Complications:            No immediate complications. Estimated Blood Loss:     Estimated blood loss: none. Impression:               - A palpable posterior hypertrophied  anal papilla                            found on digital rectal exam.                           - Diverticulosis in the entire examined colon.                           - Anal papilla(e) were hypertrophied.                           - The examination was otherwise normal on direct                            and retroflexion views.                           - No specimens collected.                           Benign anal bleeding. Likely  episodic irritation of                            anal tissue adjacent to prior surgical site or                            hypertrophied anal papilla. Nothing seen amenable                            to endoscopic intervention, no internal hemorrhoids                            seen. Recommendation:           - Patient has a contact number available for                            emergencies. The signs and symptoms of potential                            delayed complications were discussed with the                            patient. Return to normal activities tomorrow.                            Written discharge instructions were provided to the                            patient.                           - Resume previous diet.                           - Continue present medications.                           - Repeat colonoscopy in 10 years for screening                            purposes. Sherron Mapp L. Myrtie Neither, MD 11/03/2022 9:08:53 AM This report has been signed electronically.

## 2022-11-03 NOTE — Progress Notes (Signed)
History and Physical:  This patient presents for endoscopic testing for: Encounter Diagnosis  Name Primary?   Rectal bleeding Yes    Clinical details in 09/23/22 office consult note.  Rectal bleeding - ? Prior HR banding ([facility]) - no HR note on 2018 colonoscopy report.  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  Patient is otherwise without complaints or active issues today.   Past Medical History: Past Medical History:  Diagnosis Date   Allergic rhinitis    Anal skin tag    Arthritis    Atrial fibrillation (HCC)    Blepharitis    Bunion    Dermatitis, seborrheic    Diverticulosis    Hair loss    Hammer toe    Hypertension    Menopause    Osteopenia    Primary osteoarthritis of right hip    Pulmonary emboli (HCC)    Pulmonary hypertension (HCC)    Tenosynovitis, wrist    Ulnar neuropathy    Vitamin D deficiency      Past Surgical History: Past Surgical History:  Procedure Laterality Date   BREAST BIOPSY Right 1983   BUNIONECTOMY Right 2010   COLONOSCOPY     HEMORRHOID BANDING  11/2011   [facility]   HEMORRHOID SURGERY  2012   IR ANGIOGRAM SELECTIVE EACH ADDITIONAL VESSEL  11/13/2019   IR ANGIOGRAM VISCERAL SELECTIVE  11/13/2019   IR EMBO ART  VEN HEMORR LYMPH EXTRAV  INC GUIDE ROADMAPPING  11/13/2019   IR US GUIDE VASC ACCESS RIGHT  11/13/2019   right hand surgery     Right hip replaced      Allergies: No Known Allergies  Outpatient Meds: Current Outpatient Medications  Medication Sig Dispense Refill   Ascorbic Acid (VITAMIN C) 100 MG tablet Take 100 mg by mouth daily.     Cholecalciferol (VITAMIN D3) 125 MCG (5000 UT) CAPS Take by mouth.     Fluocinolone Acetonide 0.01 % OIL 5 DROPS INTO AFFECTED EAR OTIC TWICE A DAY     Magnesium 250 MG TABS Take 1 tablet by mouth daily.     ibuprofen (ADVIL) 200 MG tablet Take by mouth.     meloxicam (MOBIC) 15 MG tablet Take by mouth.     tacrolimus (PROTOPIC) 0.03 % ointment Apply topically.      Current Facility-Administered Medications  Medication Dose Route Frequency Provider Last Rate Last Admin   0.9 %  sodium chloride infusion  500 mL Intravenous Once Danis, Starr Lake III, MD          ___________________________________________________________________ Objective   Exam:  BP (!) 142/72   Pulse 82   Temp 98 F (36.7 C) (Temporal)   Ht 5\' 3"  (1.6 m)   Wt 140 lb (63.5 kg)   SpO2 97%   BMI 24.80 kg/m   CV: regular , S1/S2 Resp: clear to auscultation bilaterally, normal RR and effort noted GI: soft, no tenderness, with active bowel sounds.   Assessment: Encounter Diagnosis  Name Primary?   Rectal bleeding Yes     Plan: Colonoscopy  The benefits and risks of the planned procedure were described in detail with the patient or (when appropriate) their health care proxy.  Risks were outlined as including, but not limited to, bleeding, infection, perforation, adverse medication reaction leading to cardiac or pulmonary decompensation, pancreatitis (if ERCP).  The limitation of incomplete mucosal visualization was also discussed.  No guarantees or warranties were given.    The patient is appropriate for an endoscopic procedure  in the ambulatory setting.   - Wilfrid Lund, MD

## 2022-11-04 ENCOUNTER — Telehealth: Payer: Self-pay

## 2022-11-04 NOTE — Telephone Encounter (Signed)
  Follow up Call-     11/03/2022    7:38 AM  Call back number  Post procedure Call Back phone  # [phone]  Permission to leave phone message Yes     Patient questions:  Do you have a fever, pain , or abdominal swelling? No. Pain Score  0 *  Have you tolerated food without any problems? Yes.    Have you been able to return to your normal activities? Yes.    Do you have any questions about your discharge instructions: Diet   No. Medications  No. Follow up visit  No.  Do you have questions or concerns about your Care? No.  Actions: * If pain score is 4 or above: No action needed, pain <4.

## 2022-11-10 ENCOUNTER — Ambulatory Visit
Admission: RE | Admit: 2022-11-10 | Discharge: 2022-11-10 | Disposition: A | Discharge status: Home / Self Care | Payer: Medicare Other | Source: Ambulatory Visit | Attending: Family Medicine | Admitting: Family Medicine

## 2022-11-10 DIAGNOSIS — R9389 Abnormal findings on diagnostic imaging of other specified body structures: Secondary | ICD-10-CM

## 2023-03-29 ENCOUNTER — Ambulatory Visit: Payer: No Typology Code available for payment source | Admitting: Cardiology

## 2023-04-11 NOTE — Progress Notes (Unsigned)
Cardiology Office Note:  .   Date:  04/13/2023  ID:  Lindsey Stone, DOB 1956/01/23, MRN [id number] PCP: Sigmund Hazel, MD  [facility] Providers Cardiologist:  Yates Decamp, MD    History of Present Illness: .   Lindsey Stone is a 67 y.o. 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  She was taken off of hormone replacement therapy.   Discussed the use of AI scribe software for clinical note transcription with the patient, who gave verbal consent to proceed.  History of Present Illness   The patient, with a history of mitral valve prolapse and a previous blood clot, presents for a routine check-up. They report overall good health, with no concerns related to their arteries. Their primary concern is weight management, expressing a desire to lose weight. They currently weigh 140 pounds and would ideally like to weigh 125 pounds. They have been maintaining an active lifestyle, walking almost every day for two to three miles, with no reported shortness of breath or chest pain. However, they do report feeling extremely tired, which they attribute to a busy lifestyle and potentially inadequate rest. They report waking up in the middle of the night, but do not engage in any activities during these periods of wakefulness.  The patient also reports a history of blurry vision episodes, but states that they have not experienced any such episodes recently. They had their hip replaced a year ago and report that it has been doing well. They also monitor their blood pressure regularly and report that it has been great since stopping hormone therapy.        Review of Systems  Cardiovascular:  Negative for chest pain, dyspnea on exertion and leg  swelling.    Risk Assessment/Calculations:     External Labs:  Labs 09/30/2022:  Vitamin D 90.  Total cholesterol 158, triglycerides 65, HDL 57, LDL 89.  A1C 5.400 % 11/04/2020 TSH 4.630 09/30/2021   Hemoglobin 12.200 g/d 10/20/2021   Creatinine, Serum 0.670 mg/ 10/20/2021 Potassium 4.500 mm 10/20/2021 ALT (SGPT) 27.000 U/L 10/20/2021    Physical Exam:   VS:  BP 120/72 (BP Location: Left Arm, Patient Position: Sitting, Cuff Size: Normal)   Pulse 85   Resp 16   Ht 5\' 3"  (1.6 m)   Wt 139 lb (63 kg)   SpO2 96%   BMI 24.62 kg/m    Wt Readings from Last 3 Encounters:  04/13/23 139 lb (63 kg)  11/03/22 140 lb (63.5 kg)  09/23/22 140 lb (63.5 kg)     Physical Exam HENT:     Head: Atraumatic.  Neck:     Vascular: No carotid bruit or JVD.  Cardiovascular:     Rate and Rhythm: Normal rate and regular rhythm.     Pulses: Intact distal pulses.     Heart sounds: S1 normal and S2 normal. Murmur heard.     Crescendo-decrescendo mid to late systolic murmur is present with a grade of 2/6 at the apex.     No gallop.  Pulmonary:     Effort: Pulmonary effort is normal.     Breath sounds: Normal breath sounds.  Abdominal:     General: Bowel sounds are normal.     Palpations: Abdomen is soft.  Studies Reviewed: Marland Kitchen   EKG Interpretation Date/Time:  Tuesday April 13 2023 08:11:32 EDT Ventricular Rate:  71 PR Interval:  120 QRS Duration:  70 QT Interval:  368 QTC Calculation: 399 R Axis:   43  Text Interpretation: EKG 04/13/2023: Normal sinus rhythm, normal EKG. Confirmed by Delrae Rend [phone]) on 04/13/2023 8:24:28 AM    Echocardi[facility] 10/23/2021 at [facility]: 1.  Left ventricle: The cavity size is normal. Systolic function is normal. The estimated ejection fraction is 60-65%. Wall motion is normal; there are no regional wall motion abnormalities.  2.  Mitral valve: The valve leaflets are moderately thickened. There is moderate 2-3+ regurgitation. The posterior leaflet  is restricted with posteriorly directed mitral regurgitation.  3.  Right ventricle: The cavity size is normal. Systolic function is normal.  Compared to our office study on 01/01/2021, prolapse of the mitral leaflets with moderate to severe posteriorly directed MR. Mild to moderate TR.  ASSESSMENT AND PLAN: .      ICD-10-CM   1. Mitral valve prolapse  I34.1 EKG 12-Lead    ECHOCARDIOGRAM COMPLETE    ECHOCARDIOGRAM COMPLETE    2. Moderate mitral regurgitation  I34.0 EKG 12-Lead    ECHOCARDIOGRAM COMPLETE    ECHOCARDIOGRAM COMPLETE    3. Elevated BP without diagnosis of hypertension  R03.0 EKG 12-Lead      Assessment and Plan    Mitral Valve Prolapse Stable murmur, no change in symptoms. Last echocardiogram in 2023 showed 2-3+ MR. -Plan for repeat echocardiogram in 2025 (2 years from last echo). -Continue to monitor for symptoms of severe MR, including severe shortness of breath.  No endocarditis prophylaxis as per guidelines.  Weight Management Patient expresses desire to lose weight, current efforts include daily walking of 2-3 miles. -Encourage continued physical activity and healthy diet.  History of Blood Clot No recent issues or symptoms. -Continue current management and monitoring.  She is now off of hormone replacement therapy.  No recurrence of pulm embolism or DVT.  I reviewed external labs, renal function is normal, lipids are controlled.  Follow-up in 1 year.  Signed,  Yates Decamp, MD, [facility] 04/13/2023, 8:37 AM

## 2023-04-13 ENCOUNTER — Encounter: Payer: Self-pay | Admitting: Cardiology

## 2023-04-13 ENCOUNTER — Ambulatory Visit: Payer: Medicare Other | Attending: Cardiology | Admitting: Cardiology

## 2023-04-13 VITALS — BP 120/72 | HR 85 | Resp 16 | Ht 63.0 in | Wt 139.0 lb

## 2023-04-13 DIAGNOSIS — I341 Nonrheumatic mitral (valve) prolapse: Secondary | ICD-10-CM

## 2023-04-13 DIAGNOSIS — I340 Nonrheumatic mitral (valve) insufficiency: Secondary | ICD-10-CM

## 2023-04-13 DIAGNOSIS — R030 Elevated blood-pressure reading, without diagnosis of hypertension: Secondary | ICD-10-CM

## 2023-04-13 NOTE — Patient Instructions (Addendum)
Medication Instructions:  Your physician recommends that you continue on your current medications as directed. Please refer to the Current Medication list given to you today.  *If you need a refill on your cardiac medications before your next appointment, please call your pharmacy*  Lab Work: If you have labs (blood work) drawn today and your tests are completely normal, you will receive your results only by: MyChart Message (if you have MyChart) OR A paper copy in the mail If you have any lab test that is abnormal or we need to change your treatment, we will call you to review the results.  Testing/Procedures: Your physician has requested that you have an echocardiogram in 1 year. Echocardiography is a painless test that uses sound waves to create images of your heart. It provides your doctor with information about the size and shape of your heart and how well your heart's chambers and valves are working. This procedure takes approximately one hour. There are no restrictions for this procedure. Please do NOT wear cologne, perfume, aftershave, or lotions (deodorant is allowed). Please arrive 15 minutes prior to your appointment time.   Follow-Up: At [facility], you and your health needs are our priority.  As part of our continuing mission to provide you with exceptional heart care, we have created designated Provider Care Teams.  These Care Teams include your primary Cardiologist (physician) and Advanced Practice Providers (APPs -  Physician Assistants and Nurse Practitioners) who all work together to provide you with the care you need, when you need it.  We recommend signing up for the patient portal called "MyChart".  Sign up information is provided on this After Visit Summary.  MyChart is used to connect with patients for Virtual Visits (Telemedicine).  Patients are able to view lab/test results, encounter notes, upcoming appointments, etc.  Non-urgent messages can be sent to your  provider as well.   To learn more about what you can do with MyChart, go to [email or web address].    Your next appointment:   1 year(s)  Provider:   Yates Decamp, MD

## 2024-03-15 ENCOUNTER — Telehealth (HOSPITAL_COMMUNITY): Payer: Self-pay | Admitting: Cardiology

## 2024-03-15 NOTE — Telephone Encounter (Signed)
 I have called patient two times to schedule and she has asked me to call her back. I told patient to please call the office when she is able to schedule. Order will be removed from the echo WQ and if she calls back we will reinstate the order. Thank you.   03/15/24 called pt x 2 and she asked me to call her again.. I told patient to call the office back wehn she wa sable to schedule/LBW 9:18 03/13/24 Called and pt asked me to cal her back on 03/16/24/LBW 10:44 want to schedule sept 2025.dp   Thank you

## 2024-03-15 NOTE — Telephone Encounter (Signed)
 I am fine, she can call us  when she needs follow up and she has Dr. Cleotilde as PCP and I am sure she will refer her back to me if needed so I do not have to see her

## 2024-04-21 ENCOUNTER — Ambulatory Visit (HOSPITAL_COMMUNITY)
Admission: RE | Admit: 2024-04-21 | Discharge: 2024-04-21 | Disposition: A | Source: Ambulatory Visit | Attending: Cardiology | Admitting: Cardiology

## 2024-04-21 DIAGNOSIS — I341 Nonrheumatic mitral (valve) prolapse: Secondary | ICD-10-CM | POA: Diagnosis present

## 2024-04-21 DIAGNOSIS — I340 Nonrheumatic mitral (valve) insufficiency: Secondary | ICD-10-CM

## 2024-04-21 LAB — ECHOCARDIOGRAM COMPLETE
Area-P 1/2: 4.33 cm2
S' Lateral: 2.7 cm

## 2024-04-23 ENCOUNTER — Ambulatory Visit: Payer: Self-pay | Admitting: Cardiology

## 2024-04-23 NOTE — Progress Notes (Signed)
 Very stable and mild to at most moderate MR. No change since 2023.   MR= Mitral regurgitation. (leakage on the left heart valve)  Similarly TR means tricuspid regurgitation (leakage on the right heart valve). Mild MR or TR are probably normal variants unless physicians feel it is abnormal. Reassure.
